# Patient Record
Sex: Male | Born: 1935 | Race: White | Hispanic: No | Marital: Single | Smoking: Never smoker
Health system: Southern US, Community
[De-identification: ages and names within clinical notes are randomized; demographics above are authoritative.]

## PROBLEM LIST (undated history)

## (undated) DIAGNOSIS — I639 Cerebral infarction, unspecified: Secondary | ICD-10-CM

## (undated) DIAGNOSIS — N189 Chronic kidney disease, unspecified: Secondary | ICD-10-CM

## (undated) DIAGNOSIS — H548 Legal blindness, as defined in USA: Secondary | ICD-10-CM

## (undated) DIAGNOSIS — G5620 Lesion of ulnar nerve, unspecified upper limb: Secondary | ICD-10-CM

## (undated) DIAGNOSIS — N400 Enlarged prostate without lower urinary tract symptoms: Secondary | ICD-10-CM

## (undated) DIAGNOSIS — G4733 Obstructive sleep apnea (adult) (pediatric): Secondary | ICD-10-CM

## (undated) DIAGNOSIS — R011 Cardiac murmur, unspecified: Secondary | ICD-10-CM

## (undated) HISTORY — DX: Lesion of ulnar nerve, unspecified upper limb: G56.20

## (undated) HISTORY — DX: Cardiac murmur, unspecified: R01.1

## (undated) HISTORY — PX: SPINE SURGERY: SHX786

## (undated) HISTORY — PX: INGUINAL HERNIA REPAIR: SHX194

## (undated) HISTORY — DX: Chronic kidney disease, unspecified: N18.9

## (undated) HISTORY — DX: Benign prostatic hyperplasia without lower urinary tract symptoms: N40.0

## (undated) HISTORY — DX: Legal blindness, as defined in USA: H54.8

## (undated) HISTORY — DX: Obstructive sleep apnea (adult) (pediatric): G47.33

---

## 2004-10-28 ENCOUNTER — Emergency Department (HOSPITAL_COMMUNITY): Admission: EM | Admit: 2004-10-28 | Discharge: 2004-10-28 | Payer: Self-pay | Admitting: Family Medicine

## 2011-10-25 ENCOUNTER — Encounter (HOSPITAL_COMMUNITY): Payer: Self-pay | Admitting: *Deleted

## 2011-10-25 ENCOUNTER — Emergency Department (HOSPITAL_COMMUNITY)
Admission: EM | Admit: 2011-10-25 | Discharge: 2011-10-25 | Disposition: A | Discharge status: Home / Self Care | Payer: Medicare Other | Attending: Emergency Medicine | Admitting: Emergency Medicine

## 2011-10-25 DIAGNOSIS — R319 Hematuria, unspecified: Secondary | ICD-10-CM | POA: Insufficient documentation

## 2011-10-25 DIAGNOSIS — Z8673 Personal history of transient ischemic attack (TIA), and cerebral infarction without residual deficits: Secondary | ICD-10-CM | POA: Insufficient documentation

## 2011-10-25 HISTORY — DX: Cerebral infarction, unspecified: I63.9

## 2011-10-25 LAB — URINALYSIS, ROUTINE W REFLEX MICROSCOPIC
Glucose, UA: NEGATIVE mg/dL
Ketones, ur: 15 mg/dL — AB
Protein, ur: 100 mg/dL — AB

## 2011-10-25 LAB — URINE MICROSCOPIC-ADD ON

## 2011-10-25 MED ORDER — CIPROFLOXACIN HCL 500 MG PO TABS: 500.0000 mg | ORAL_TABLET | Freq: Two times a day (BID) | ORAL | Status: AC

## 2011-10-25 MED ORDER — CIPROFLOXACIN HCL 500 MG PO TABS
500.0000 mg | ORAL_TABLET | Freq: Once | ORAL | Status: AC
  Administered 2011-10-25: 500 mg via ORAL
  Filled 2011-10-25: qty 1

## 2011-10-25 NOTE — ED Provider Notes (Signed)
History     CSN: [id number]  Arrival date & time 10/25/11  0212   First MD Initiated Contact with Patient 10/25/11 0325      Chief Complaint  Patient presents with  . Hematuria    (Consider location/radiation/quality/duration/timing/severity/associated sxs/prior treatment) HPI Comments: 78 year old male with a past medical history significant for a stroke but no other medical problems presents with a complaint of hematuria. He states that at 1:00 AM he had acute onset of bright red blood in his urine without any pain, hesitancy, dysuria, nausea, fevers or back pain. Symptoms are intermittent, there is the presence of blood clots associated with the urine, he has never had this before and is not on anticoagulation. He denies any other date weight loss fevers chills nausea vomiting or any other complaints of.  Patient is a 78 y.o. male presenting with hematuria. The history is provided by the patient and the spouse.  Hematuria    Past Medical History  Diagnosis Date  . Stroke     History reviewed. No pertinent past surgical history.  History reviewed. No pertinent family history.  History  Substance Use Topics  . Smoking status: Never Smoker   . Smokeless tobacco: Not on file  . Alcohol Use: No      Review of Systems  Genitourinary: Positive for hematuria.  All other systems reviewed and are negative.    Allergies  Review of patient's allergies indicates no known allergies.  Home Medications   Current Outpatient Rx  Name Route Sig Dispense Refill  . ACETAMINOPHEN 325 MG PO TABS Oral Take 650 mg by mouth every 6 (six) hours as needed. For pain    . CIPROFLOXACIN HCL 500 MG PO TABS Oral Take 1 tablet (500 mg total) by mouth 2 (two) times daily. 14 tablet 0    BP 156/90  Pulse 72  Temp(Src) 98.1 F (36.7 C) (Oral)  Resp 20  SpO2 95%  Physical Exam  Nursing note and vitals reviewed. Constitutional: He appears well-developed and well-nourished. No distress.    HENT:  Head: Normocephalic and atraumatic.  Mouth/Throat: Oropharynx is clear and moist. No oropharyngeal exudate.  Eyes: Conjunctivae and EOM are normal. Pupils are equal, round, and reactive to light. Right eye exhibits no discharge. Left eye exhibits no discharge. No scleral icterus.  Neck: Normal range of motion. Neck supple. No JVD present. No thyromegaly present.  Cardiovascular: Normal rate, regular rhythm, normal heart sounds and intact distal pulses.  Exam reveals no gallop and no friction rub.   No murmur heard. Pulmonary/Chest: Effort normal and breath sounds normal. No respiratory distress. He has no wheezes. He has no rales.  Abdominal: Soft. Bowel sounds are normal. He exhibits no distension and no mass. There is no tenderness.  Genitourinary:       Normal appearing penis and testicles, urethral meatus clear  Musculoskeletal: Normal range of motion. He exhibits no edema and no tenderness.  Lymphadenopathy:    He has no cervical adenopathy.  Neurological: He is alert. Coordination normal.  Skin: Skin is warm and dry. No rash noted. No erythema.  Psychiatric: He has a normal mood and affect. His behavior is normal.    ED Course  Procedures (including critical care time)  Labs Reviewed  URINALYSIS, ROUTINE W REFLEX MICROSCOPIC - Abnormal; Notable for the following:    Color, Urine RED (*) BIOCHEMICALS MAY BE AFFECTED BY COLOR   APPearance TURBID (*)    Hgb urine dipstick LARGE (*)    Ketones, ur  15 (*)    Protein, ur 100 (*)    Leukocytes, UA SMALL (*)    All other components within normal limits  URINE MICROSCOPIC-ADD ON  URINE CULTURE   No results found.   1. Hematuria - cause not known       MDM  No abdominal tenderness, normal appearing penis, vital signs with mild hypertension and urinalysis according to my interpretation has gross hematuria but no obvious signs of infection. Due to the acute onset of his symptoms without any other prodromal symptoms I  suspect this may be related to a urinary infection however if treatment with ciprofloxacin does not improve his symptoms will have referred to urologist for further workup for a systematic hematuria. Patient precautions given for return should he not be able to urinate.        Vida Roller, MD 10/25/11 [phone]

## 2011-10-25 NOTE — Discharge Instructions (Signed)
Take the antibiotic exactly as prescribed, return to see your doctor or the urologist listed above, call in the morning for an appointment this week. Return to the emergency Department if you're unable to urinate or started to develop lower abdominal pain, weakness, fever or severe vomiting.

## 2011-10-25 NOTE — ED Notes (Signed)
Bloody urine with clots since 0100am today. No  n or v

## 2011-10-25 NOTE — ED Notes (Addendum)
Patient reports hematuria since 0100 this morning; patients reports passing around 10 clots with one urination; patient denies history of hematuria.  Patient denies pain, including burning during urination and abdominal pain.  Patient denies taking blood thinners.  Patient alert and oriented x4; PERRL present. Upon arrival to room, patient changed into gown.  Urine sample collected.  Family present at bedside.  Will continue to monitor.

## 2011-10-30 ENCOUNTER — Other Ambulatory Visit: Payer: Self-pay | Admitting: Internal Medicine

## 2011-10-30 DIAGNOSIS — M5412 Radiculopathy, cervical region: Secondary | ICD-10-CM

## 2011-11-04 ENCOUNTER — Ambulatory Visit
Admission: RE | Admit: 2011-11-04 | Discharge: 2011-11-04 | Disposition: A | Discharge status: Home / Self Care | Payer: Medicare Other | Source: Ambulatory Visit | Attending: Internal Medicine | Admitting: Internal Medicine

## 2011-11-04 DIAGNOSIS — M5412 Radiculopathy, cervical region: Secondary | ICD-10-CM

## 2012-04-24 ENCOUNTER — Other Ambulatory Visit: Payer: Self-pay | Admitting: Neurological Surgery

## 2012-04-24 DIAGNOSIS — M542 Cervicalgia: Secondary | ICD-10-CM

## 2012-05-02 ENCOUNTER — Ambulatory Visit
Admission: RE | Admit: 2012-05-02 | Discharge: 2012-05-02 | Disposition: A | Discharge status: Home / Self Care | Payer: Medicare Other | Source: Ambulatory Visit | Attending: Neurological Surgery | Admitting: Neurological Surgery

## 2012-05-02 DIAGNOSIS — M542 Cervicalgia: Secondary | ICD-10-CM

## 2012-08-07 ENCOUNTER — Encounter (INDEPENDENT_AMBULATORY_CARE_PROVIDER_SITE_OTHER): Payer: Medicare Other | Admitting: Ophthalmology

## 2012-08-07 DIAGNOSIS — H47219 Primary optic atrophy, unspecified eye: Secondary | ICD-10-CM

## 2012-08-07 DIAGNOSIS — H43819 Vitreous degeneration, unspecified eye: Secondary | ICD-10-CM

## 2012-08-07 DIAGNOSIS — H3530 Unspecified macular degeneration: Secondary | ICD-10-CM

## 2012-08-07 DIAGNOSIS — H2510 Age-related nuclear cataract, unspecified eye: Secondary | ICD-10-CM

## 2012-08-10 ENCOUNTER — Encounter (INDEPENDENT_AMBULATORY_CARE_PROVIDER_SITE_OTHER): Payer: Medicare Other | Admitting: Ophthalmology

## 2012-12-14 ENCOUNTER — Ambulatory Visit (INDEPENDENT_AMBULATORY_CARE_PROVIDER_SITE_OTHER): Payer: Medicare Other | Admitting: Ophthalmology

## 2012-12-14 DIAGNOSIS — H35379 Puckering of macula, unspecified eye: Secondary | ICD-10-CM

## 2012-12-14 DIAGNOSIS — H2510 Age-related nuclear cataract, unspecified eye: Secondary | ICD-10-CM

## 2012-12-14 DIAGNOSIS — H3530 Unspecified macular degeneration: Secondary | ICD-10-CM

## 2012-12-14 DIAGNOSIS — H47219 Primary optic atrophy, unspecified eye: Secondary | ICD-10-CM

## 2012-12-14 DIAGNOSIS — H43819 Vitreous degeneration, unspecified eye: Secondary | ICD-10-CM

## 2013-06-15 ENCOUNTER — Ambulatory Visit (INDEPENDENT_AMBULATORY_CARE_PROVIDER_SITE_OTHER): Payer: Medicare Other | Admitting: Ophthalmology

## 2013-06-15 DIAGNOSIS — H3530 Unspecified macular degeneration: Secondary | ICD-10-CM

## 2013-06-15 DIAGNOSIS — Q120 Congenital cataract: Secondary | ICD-10-CM

## 2013-06-15 DIAGNOSIS — H43819 Vitreous degeneration, unspecified eye: Secondary | ICD-10-CM

## 2013-06-15 DIAGNOSIS — H35379 Puckering of macula, unspecified eye: Secondary | ICD-10-CM

## 2013-12-16 ENCOUNTER — Ambulatory Visit (INDEPENDENT_AMBULATORY_CARE_PROVIDER_SITE_OTHER): Payer: Medicare Other | Admitting: Ophthalmology

## 2013-12-16 DIAGNOSIS — H2510 Age-related nuclear cataract, unspecified eye: Secondary | ICD-10-CM

## 2013-12-16 DIAGNOSIS — H3530 Unspecified macular degeneration: Secondary | ICD-10-CM

## 2013-12-16 DIAGNOSIS — H43819 Vitreous degeneration, unspecified eye: Secondary | ICD-10-CM

## 2013-12-16 DIAGNOSIS — H47219 Primary optic atrophy, unspecified eye: Secondary | ICD-10-CM

## 2014-06-20 ENCOUNTER — Ambulatory Visit (INDEPENDENT_AMBULATORY_CARE_PROVIDER_SITE_OTHER): Payer: Medicare Other | Admitting: Ophthalmology

## 2014-07-26 ENCOUNTER — Encounter (INDEPENDENT_AMBULATORY_CARE_PROVIDER_SITE_OTHER): Payer: Medicare Other | Admitting: Ophthalmology

## 2014-07-28 ENCOUNTER — Encounter (INDEPENDENT_AMBULATORY_CARE_PROVIDER_SITE_OTHER): Payer: Medicare Other | Admitting: Ophthalmology

## 2014-07-28 DIAGNOSIS — H35372 Puckering of macula, left eye: Secondary | ICD-10-CM

## 2014-07-28 DIAGNOSIS — H43813 Vitreous degeneration, bilateral: Secondary | ICD-10-CM

## 2014-07-28 DIAGNOSIS — H3531 Nonexudative age-related macular degeneration: Secondary | ICD-10-CM

## 2014-07-28 DIAGNOSIS — H59032 Cystoid macular edema following cataract surgery, left eye: Secondary | ICD-10-CM

## 2014-09-08 ENCOUNTER — Encounter (INDEPENDENT_AMBULATORY_CARE_PROVIDER_SITE_OTHER): Payer: Medicare Other | Admitting: Ophthalmology

## 2014-09-08 DIAGNOSIS — H59032 Cystoid macular edema following cataract surgery, left eye: Secondary | ICD-10-CM

## 2014-09-08 DIAGNOSIS — H35372 Puckering of macula, left eye: Secondary | ICD-10-CM | POA: Diagnosis not present

## 2014-09-08 DIAGNOSIS — H3531 Nonexudative age-related macular degeneration: Secondary | ICD-10-CM

## 2014-09-08 DIAGNOSIS — H43813 Vitreous degeneration, bilateral: Secondary | ICD-10-CM | POA: Diagnosis not present

## 2014-11-03 ENCOUNTER — Encounter (INDEPENDENT_AMBULATORY_CARE_PROVIDER_SITE_OTHER): Payer: Medicare Other | Admitting: Ophthalmology

## 2014-11-03 DIAGNOSIS — H59032 Cystoid macular edema following cataract surgery, left eye: Secondary | ICD-10-CM

## 2014-11-03 DIAGNOSIS — H47211 Primary optic atrophy, right eye: Secondary | ICD-10-CM | POA: Diagnosis not present

## 2014-11-03 DIAGNOSIS — H3531 Nonexudative age-related macular degeneration: Secondary | ICD-10-CM | POA: Diagnosis not present

## 2014-11-03 DIAGNOSIS — H43813 Vitreous degeneration, bilateral: Secondary | ICD-10-CM | POA: Diagnosis not present

## 2014-11-03 DIAGNOSIS — H35372 Puckering of macula, left eye: Secondary | ICD-10-CM | POA: Diagnosis not present

## 2014-12-13 ENCOUNTER — Encounter (INDEPENDENT_AMBULATORY_CARE_PROVIDER_SITE_OTHER): Payer: Medicare Other | Admitting: Ophthalmology

## 2014-12-26 ENCOUNTER — Encounter (INDEPENDENT_AMBULATORY_CARE_PROVIDER_SITE_OTHER): Payer: Medicare Other | Admitting: Ophthalmology

## 2014-12-26 DIAGNOSIS — H3531 Nonexudative age-related macular degeneration: Secondary | ICD-10-CM | POA: Diagnosis not present

## 2014-12-26 DIAGNOSIS — H59032 Cystoid macular edema following cataract surgery, left eye: Secondary | ICD-10-CM

## 2014-12-26 DIAGNOSIS — H43813 Vitreous degeneration, bilateral: Secondary | ICD-10-CM | POA: Diagnosis not present

## 2015-01-13 ENCOUNTER — Other Ambulatory Visit: Payer: Self-pay | Admitting: Internal Medicine

## 2015-01-13 DIAGNOSIS — L03116 Cellulitis of left lower limb: Secondary | ICD-10-CM

## 2015-02-09 ENCOUNTER — Encounter: Payer: Self-pay | Admitting: Vascular Surgery

## 2015-02-09 ENCOUNTER — Other Ambulatory Visit: Payer: Self-pay | Admitting: *Deleted

## 2015-02-09 DIAGNOSIS — M79605 Pain in left leg: Secondary | ICD-10-CM

## 2015-03-02 ENCOUNTER — Encounter (INDEPENDENT_AMBULATORY_CARE_PROVIDER_SITE_OTHER): Payer: Medicare Other | Admitting: Ophthalmology

## 2015-03-02 DIAGNOSIS — H3531 Nonexudative age-related macular degeneration: Secondary | ICD-10-CM | POA: Diagnosis not present

## 2015-03-02 DIAGNOSIS — H59032 Cystoid macular edema following cataract surgery, left eye: Secondary | ICD-10-CM

## 2015-03-02 DIAGNOSIS — H35372 Puckering of macula, left eye: Secondary | ICD-10-CM

## 2015-03-02 DIAGNOSIS — H47211 Primary optic atrophy, right eye: Secondary | ICD-10-CM | POA: Diagnosis not present

## 2015-03-10 ENCOUNTER — Encounter: Payer: Self-pay | Admitting: Vascular Surgery

## 2015-03-14 ENCOUNTER — Encounter: Payer: Self-pay | Admitting: Vascular Surgery

## 2015-03-14 ENCOUNTER — Ambulatory Visit (HOSPITAL_COMMUNITY)
Admission: RE | Admit: 2015-03-14 | Discharge: 2015-03-14 | Disposition: A | Discharge status: Home / Self Care | Payer: Medicare Other | Source: Ambulatory Visit | Attending: Vascular Surgery | Admitting: Vascular Surgery

## 2015-03-14 ENCOUNTER — Ambulatory Visit (INDEPENDENT_AMBULATORY_CARE_PROVIDER_SITE_OTHER): Payer: Medicare Other | Admitting: Vascular Surgery

## 2015-03-14 VITALS — BP 154/92 | HR 74 | Temp 97.8°F | Resp 18 | Ht 69.0 in | Wt 242.0 lb

## 2015-03-14 DIAGNOSIS — I82812 Embolism and thrombosis of superficial veins of left lower extremities: Secondary | ICD-10-CM | POA: Diagnosis not present

## 2015-03-14 DIAGNOSIS — M79605 Pain in left leg: Secondary | ICD-10-CM | POA: Diagnosis present

## 2015-03-14 DIAGNOSIS — I8312 Varicose veins of left lower extremity with inflammation: Secondary | ICD-10-CM | POA: Diagnosis not present

## 2015-03-14 DIAGNOSIS — I8392 Asymptomatic varicose veins of left lower extremity: Secondary | ICD-10-CM | POA: Diagnosis not present

## 2015-03-14 NOTE — Progress Notes (Signed)
Patient name: Austin Dyer MRN: [id number] DOB: [birth year] Sex: male   Referred by: Bosie Clos  Reason for referral:  Chief Complaint  Patient presents with  . Re-evaluation    LLE pain   REF- Dr. Bosie Clos  History of thrombophlebitis LLE   Has been on antibiotics with no improvement.      HISTORY OF PRESENT ILLNESS:  patient presents today for evaluation of left leg venous hypertension. He is very pleasant 81 year old gentleman who has a remote history of what sounds like trivial trauma to his medial ankle and prolonged healing. Probably had distal vein stripping at that time from his knee distally. These records are not available. He has done well for many years. Recently he had an episode of inflammation and cellulitis on the medial aspect of his left ankle with marked swelling. He did respond to  antibody treatment. He did not develop an open ulcer at this time. He does not wear compression garments. His Brother and sister are with him today will care for him. He is legally blind.  Past Medical History  Diagnosis Date  . Stroke   . Sleep apnea, obstructive   . BPH (benign prostatic hyperplasia)   . Ulnar nerve compression   . Chronic kidney disease     History of kidney stone   . Heart murmur   . Legally blind     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, Legally blind in Left eye    Past Surgical History  Procedure Laterality Date  . Spine surgery      Disk surgery   . Inguinal hernia repair Bilateral     History   Social History  . Marital Status: Single    Spouse Name: N/A  . Number of Children: N/A  . Years of Education: N/A   Occupational History  . Not on file.   Social History Main Topics  . Smoking status: Never Smoker   . Smokeless tobacco: Not on file  . Alcohol Use: No  . Drug Use: No  . Sexual Activity: Not on file   Other Topics Concern  . Not on file   Social History Narrative    Family History  Problem Relation Age of Onset  . Cancer Mother    Sarcoma, rectal cancer  . Heart disease Father   . Diabetes Father   . Hypertension Sister   . Hyperlipidemia Sister   . Heart disease Sister   . Diabetes Sister   . Diabetes Brother     Allergies as of 03/14/2015  . (No Known Allergies)    Current Outpatient Prescriptions on File Prior to Visit  Medication Sig Dispense Refill  . acetaminophen (TYLENOL) 325 MG tablet Take 650 mg by mouth every 6 (six) hours as needed. For pain    . Brinzolamide-Brimonidine (SIMBRINZA) 1-0.2 % SUSP Apply 1 drop to eye. Place 1 (drop) into affected eye three times a day    . finasteride (PROSCAR) 5 MG tablet Take 5 mg by mouth daily.    . nepafenac (ILEVRO) 0.3 % ophthalmic suspension 1 drop daily.    . prednisoLONE acetate (PRED FORTE) 1 % ophthalmic suspension 1 drop 4 (four) times daily.    . cephALEXin (KEFLEX) 500 MG capsule Take 500 mg by mouth 2 (two) times daily.    . tamsulosin (FLOMAX) 0.4 MG CAPS capsule Take 0.4 mg by mouth daily. After the same meal each day.     No current facility-administered medications on file prior to  visit.     REVIEW OF SYSTEMS:  Positives indicated with an "X"  CARDIOVASCULAR:  [ ]  chest pain   [ ]  chest pressure   [ ]  palpitations   [ ]  orthopnea   [ ]  dyspnea on exertion   [ ]  claudication   [ ]  rest pain   [ ]  DVT   [x ] phlebitis PULMONARY:   [ ]  productive cough   [ ]  asthma   [ ]  wheezing NEUROLOGIC:   [ ]  weakness  [ ]  paresthesias  [ ]  aphasia  [ ]  amaurosis  [ ]  dizziness HEMATOLOGIC:   [ ]  bleeding problems   [ ]  clotting disorders MUSCULOSKELETAL:  [ ]  joint pain   [ ]  joint swelling GASTROINTESTINAL: [ ]   blood in stool  [ ]   hematemesis GENITOURINARY:  [ ]   dysuria  [ ]   hematuria PSYCHIATRIC:  [ ]  history of major depression INTEGUMENTARY:  [ ]  rashes  [ ]  ulcers CONSTITUTIONAL:  [ ]  fever   [ ]  chills  PHYSICAL EXAMINATION:  General: The patient is a well-nourished male, in no acute distress. Vital signs are BP 154/92 mmHg  Pulse 74   Temp(Src) 97.8 F (36.6 C) (Oral)  Resp 18  Ht 5\' 9"  (1.753 m)  Wt 242 lb (109.77 kg)  BMI 35.72 kg/m2  SpO2 99% Pulmonary: There is a good air exchange  Abdomen: Soft and non-tender  Musculoskeletal: There are no major deformities.  There is no significant extremity pain. Neurologic: No focal weakness or paresthesias are detected, Skin: significant swelling and erythema with old healed ulcer on the medial aspect of his left ankle. Psychiatric: The patient has normal affect. Cardiovascular:  2+ dorsalis pedis pulses bilaterally   [facility] Vascular Lab Studies:  Ordered and Independently Reviewed  This reveals reflux that is the great saphenous vein with marked enlargement of 1.o cm in diameter. There is superficial thrombophlebitis in the mid to distal calf great saphenous vein and varicosities surrounding this.  Impression and Plan:   chronic venous hypertension with superficial thrombophlebitis and cellulitis in the medial ankle. Fortunately has no evidence of DVT and no significant deep venous reflux. I did reimage his saphenous vein with SonoSite and this is quite enlarged throughout its course. He would be an excellent candidate for laser ablation of his great saphenous vein for reduction of venous hypertension should he fail conservative treatment. I have fitted him today for thigh high 20-30 mm graduated compression garments and instructed on the use of these. He does live alone and has debilities which may make it difficult for him to tolerate the compression. Plan see him again in 3 months for continued discussion    Sylar Voong [facility] Office: [phone]

## 2015-05-04 ENCOUNTER — Encounter (INDEPENDENT_AMBULATORY_CARE_PROVIDER_SITE_OTHER): Payer: Medicare Other | Admitting: Ophthalmology

## 2015-05-04 DIAGNOSIS — H35372 Puckering of macula, left eye: Secondary | ICD-10-CM | POA: Diagnosis not present

## 2015-05-04 DIAGNOSIS — H3531 Nonexudative age-related macular degeneration: Secondary | ICD-10-CM | POA: Diagnosis not present

## 2015-05-04 DIAGNOSIS — H59032 Cystoid macular edema following cataract surgery, left eye: Secondary | ICD-10-CM | POA: Diagnosis not present

## 2015-05-04 DIAGNOSIS — H47231 Glaucomatous optic atrophy, right eye: Secondary | ICD-10-CM | POA: Diagnosis not present

## 2015-05-04 DIAGNOSIS — H43813 Vitreous degeneration, bilateral: Secondary | ICD-10-CM

## 2015-06-27 ENCOUNTER — Ambulatory Visit: Payer: Medicare Other | Admitting: Vascular Surgery

## 2015-08-03 ENCOUNTER — Encounter (INDEPENDENT_AMBULATORY_CARE_PROVIDER_SITE_OTHER): Payer: Medicare Other | Admitting: Ophthalmology

## 2015-08-03 DIAGNOSIS — H35373 Puckering of macula, bilateral: Secondary | ICD-10-CM

## 2015-08-03 DIAGNOSIS — H59032 Cystoid macular edema following cataract surgery, left eye: Secondary | ICD-10-CM | POA: Diagnosis not present

## 2015-08-03 DIAGNOSIS — H353134 Nonexudative age-related macular degeneration, bilateral, advanced atrophic with subfoveal involvement: Secondary | ICD-10-CM

## 2015-08-03 DIAGNOSIS — H43813 Vitreous degeneration, bilateral: Secondary | ICD-10-CM

## 2015-12-14 ENCOUNTER — Ambulatory Visit (INDEPENDENT_AMBULATORY_CARE_PROVIDER_SITE_OTHER): Payer: Medicare Other | Admitting: Ophthalmology

## 2015-12-14 DIAGNOSIS — H353122 Nonexudative age-related macular degeneration, left eye, intermediate dry stage: Secondary | ICD-10-CM | POA: Diagnosis not present

## 2015-12-14 DIAGNOSIS — H59032 Cystoid macular edema following cataract surgery, left eye: Secondary | ICD-10-CM

## 2015-12-14 DIAGNOSIS — H43813 Vitreous degeneration, bilateral: Secondary | ICD-10-CM

## 2015-12-14 DIAGNOSIS — I10 Essential (primary) hypertension: Secondary | ICD-10-CM

## 2015-12-14 DIAGNOSIS — H353114 Nonexudative age-related macular degeneration, right eye, advanced atrophic with subfoveal involvement: Secondary | ICD-10-CM

## 2015-12-14 DIAGNOSIS — H35033 Hypertensive retinopathy, bilateral: Secondary | ICD-10-CM | POA: Diagnosis not present

## 2016-04-18 ENCOUNTER — Ambulatory Visit (INDEPENDENT_AMBULATORY_CARE_PROVIDER_SITE_OTHER): Payer: Medicare Other | Admitting: Ophthalmology

## 2016-04-18 DIAGNOSIS — H59032 Cystoid macular edema following cataract surgery, left eye: Secondary | ICD-10-CM

## 2016-04-18 DIAGNOSIS — H35033 Hypertensive retinopathy, bilateral: Secondary | ICD-10-CM | POA: Diagnosis not present

## 2016-04-18 DIAGNOSIS — I10 Essential (primary) hypertension: Secondary | ICD-10-CM | POA: Diagnosis not present

## 2016-04-18 DIAGNOSIS — H353132 Nonexudative age-related macular degeneration, bilateral, intermediate dry stage: Secondary | ICD-10-CM | POA: Diagnosis not present

## 2016-04-18 DIAGNOSIS — H43813 Vitreous degeneration, bilateral: Secondary | ICD-10-CM | POA: Diagnosis not present

## 2016-04-18 DIAGNOSIS — H35372 Puckering of macula, left eye: Secondary | ICD-10-CM

## 2016-08-27 ENCOUNTER — Ambulatory Visit (INDEPENDENT_AMBULATORY_CARE_PROVIDER_SITE_OTHER): Payer: Medicare Other | Admitting: Ophthalmology

## 2016-08-27 DIAGNOSIS — H47211 Primary optic atrophy, right eye: Secondary | ICD-10-CM | POA: Diagnosis not present

## 2016-08-27 DIAGNOSIS — H353132 Nonexudative age-related macular degeneration, bilateral, intermediate dry stage: Secondary | ICD-10-CM

## 2016-08-27 DIAGNOSIS — H59032 Cystoid macular edema following cataract surgery, left eye: Secondary | ICD-10-CM | POA: Diagnosis not present

## 2016-08-27 DIAGNOSIS — I10 Essential (primary) hypertension: Secondary | ICD-10-CM

## 2016-08-27 DIAGNOSIS — H35033 Hypertensive retinopathy, bilateral: Secondary | ICD-10-CM

## 2016-08-27 DIAGNOSIS — H35372 Puckering of macula, left eye: Secondary | ICD-10-CM

## 2016-12-26 ENCOUNTER — Ambulatory Visit (INDEPENDENT_AMBULATORY_CARE_PROVIDER_SITE_OTHER): Payer: Medicare Other | Admitting: Ophthalmology

## 2016-12-26 DIAGNOSIS — H47211 Primary optic atrophy, right eye: Secondary | ICD-10-CM | POA: Diagnosis not present

## 2016-12-26 DIAGNOSIS — H59032 Cystoid macular edema following cataract surgery, left eye: Secondary | ICD-10-CM

## 2016-12-26 DIAGNOSIS — H35373 Puckering of macula, bilateral: Secondary | ICD-10-CM | POA: Diagnosis not present

## 2016-12-26 DIAGNOSIS — H353132 Nonexudative age-related macular degeneration, bilateral, intermediate dry stage: Secondary | ICD-10-CM

## 2016-12-26 DIAGNOSIS — H35033 Hypertensive retinopathy, bilateral: Secondary | ICD-10-CM | POA: Diagnosis not present

## 2016-12-26 DIAGNOSIS — I10 Essential (primary) hypertension: Secondary | ICD-10-CM | POA: Diagnosis not present

## 2017-05-26 ENCOUNTER — Ambulatory Visit (INDEPENDENT_AMBULATORY_CARE_PROVIDER_SITE_OTHER): Payer: Medicare Other | Admitting: Ophthalmology

## 2017-05-26 DIAGNOSIS — H35373 Puckering of macula, bilateral: Secondary | ICD-10-CM | POA: Diagnosis not present

## 2017-05-26 DIAGNOSIS — I10 Essential (primary) hypertension: Secondary | ICD-10-CM

## 2017-05-26 DIAGNOSIS — H353132 Nonexudative age-related macular degeneration, bilateral, intermediate dry stage: Secondary | ICD-10-CM | POA: Diagnosis not present

## 2017-05-26 DIAGNOSIS — H59032 Cystoid macular edema following cataract surgery, left eye: Secondary | ICD-10-CM | POA: Diagnosis not present

## 2017-05-26 DIAGNOSIS — H35033 Hypertensive retinopathy, bilateral: Secondary | ICD-10-CM | POA: Diagnosis not present

## 2017-05-26 DIAGNOSIS — H43813 Vitreous degeneration, bilateral: Secondary | ICD-10-CM

## 2017-06-27 ENCOUNTER — Observation Stay: Payer: Medicare Other

## 2017-06-27 ENCOUNTER — Encounter: Payer: Self-pay | Admitting: Emergency Medicine

## 2017-06-27 ENCOUNTER — Inpatient Hospital Stay
Admission: EM | Admit: 2017-06-27 | Discharge: 2017-07-04 | DRG: 378 | Disposition: A | Discharge status: Skilled Nursing Facility | Payer: Medicare Other | Attending: Internal Medicine | Admitting: Internal Medicine

## 2017-06-27 ENCOUNTER — Emergency Department: Payer: Medicare Other

## 2017-06-27 DIAGNOSIS — R195 Other fecal abnormalities: Secondary | ICD-10-CM

## 2017-06-27 DIAGNOSIS — D62 Acute posthemorrhagic anemia: Secondary | ICD-10-CM | POA: Diagnosis present

## 2017-06-27 DIAGNOSIS — Y92009 Unspecified place in unspecified non-institutional (private) residence as the place of occurrence of the external cause: Secondary | ICD-10-CM

## 2017-06-27 DIAGNOSIS — M25559 Pain in unspecified hip: Secondary | ICD-10-CM | POA: Diagnosis present

## 2017-06-27 DIAGNOSIS — R4182 Altered mental status, unspecified: Secondary | ICD-10-CM

## 2017-06-27 DIAGNOSIS — G4733 Obstructive sleep apnea (adult) (pediatric): Secondary | ICD-10-CM | POA: Diagnosis present

## 2017-06-27 DIAGNOSIS — N179 Acute kidney failure, unspecified: Secondary | ICD-10-CM

## 2017-06-27 DIAGNOSIS — J9811 Atelectasis: Secondary | ICD-10-CM | POA: Diagnosis not present

## 2017-06-27 DIAGNOSIS — N400 Enlarged prostate without lower urinary tract symptoms: Secondary | ICD-10-CM | POA: Diagnosis present

## 2017-06-27 DIAGNOSIS — W19XXXA Unspecified fall, initial encounter: Secondary | ICD-10-CM

## 2017-06-27 DIAGNOSIS — R51 Headache: Secondary | ICD-10-CM | POA: Diagnosis not present

## 2017-06-27 DIAGNOSIS — K254 Chronic or unspecified gastric ulcer with hemorrhage: Secondary | ICD-10-CM | POA: Diagnosis not present

## 2017-06-27 DIAGNOSIS — D638 Anemia in other chronic diseases classified elsewhere: Secondary | ICD-10-CM | POA: Diagnosis present

## 2017-06-27 DIAGNOSIS — R338 Other retention of urine: Secondary | ICD-10-CM | POA: Diagnosis present

## 2017-06-27 DIAGNOSIS — Z8673 Personal history of transient ischemic attack (TIA), and cerebral infarction without residual deficits: Secondary | ICD-10-CM

## 2017-06-27 DIAGNOSIS — D72829 Elevated white blood cell count, unspecified: Secondary | ICD-10-CM | POA: Diagnosis not present

## 2017-06-27 DIAGNOSIS — M25551 Pain in right hip: Secondary | ICD-10-CM | POA: Diagnosis present

## 2017-06-27 DIAGNOSIS — R05 Cough: Secondary | ICD-10-CM

## 2017-06-27 DIAGNOSIS — H548 Legal blindness, as defined in USA: Secondary | ICD-10-CM | POA: Diagnosis present

## 2017-06-27 DIAGNOSIS — R059 Cough, unspecified: Secondary | ICD-10-CM

## 2017-06-27 DIAGNOSIS — E876 Hypokalemia: Secondary | ICD-10-CM | POA: Diagnosis not present

## 2017-06-27 DIAGNOSIS — N401 Enlarged prostate with lower urinary tract symptoms: Secondary | ICD-10-CM | POA: Diagnosis present

## 2017-06-27 DIAGNOSIS — N183 Chronic kidney disease, stage 3 (moderate): Secondary | ICD-10-CM | POA: Diagnosis present

## 2017-06-27 DIAGNOSIS — I129 Hypertensive chronic kidney disease with stage 1 through stage 4 chronic kidney disease, or unspecified chronic kidney disease: Secondary | ICD-10-CM | POA: Diagnosis present

## 2017-06-27 DIAGNOSIS — K922 Gastrointestinal hemorrhage, unspecified: Secondary | ICD-10-CM

## 2017-06-27 DIAGNOSIS — R109 Unspecified abdominal pain: Secondary | ICD-10-CM

## 2017-06-27 LAB — COMPREHENSIVE METABOLIC PANEL
ALK PHOS: 61 U/L (ref 38–126)
ALT: 12 U/L — AB (ref 17–63)
AST: 17 U/L (ref 15–41)
Albumin: 3 g/dL — ABNORMAL LOW (ref 3.5–5.0)
Anion gap: 7 (ref 5–15)
BILIRUBIN TOTAL: 0.5 mg/dL (ref 0.3–1.2)
BUN: 93 mg/dL — AB (ref 6–20)
CALCIUM: 8.9 mg/dL (ref 8.9–10.3)
CHLORIDE: 109 mmol/L (ref 101–111)
CO2: 24 mmol/L (ref 22–32)
CREATININE: 1.95 mg/dL — AB (ref 0.61–1.24)
GFR, EST AFRICAN AMERICAN: 35 mL/min — AB (ref >60)
GFR, EST NON AFRICAN AMERICAN: 31 mL/min — AB (ref >60)
Glucose, Bld: 131 mg/dL — ABNORMAL HIGH (ref 65–99)
Potassium: 4.4 mmol/L (ref 3.5–5.1)
Sodium: 140 mmol/L (ref 135–145)
TOTAL PROTEIN: 6.6 g/dL (ref 6.5–8.1)

## 2017-06-27 LAB — CBC WITH DIFFERENTIAL/PLATELET
BASOS ABS: 0.1 10*3/uL (ref 0–0.1)
BASOS PCT: 1 %
EOS ABS: 0.4 10*3/uL (ref 0–0.7)
EOS PCT: 2 %
HCT: 27.1 % — ABNORMAL LOW (ref 40.0–52.0)
HEMOGLOBIN: 8.9 g/dL — AB (ref 13.0–18.0)
Lymphocytes Relative: 8 %
Lymphs Abs: 1.2 10*3/uL (ref 1.0–3.6)
MCH: 26.8 pg (ref 26.0–34.0)
MCHC: 32.7 g/dL (ref 32.0–36.0)
MCV: 82 fL (ref 80.0–100.0)
Monocytes Absolute: 0.8 10*3/uL (ref 0.2–1.0)
Monocytes Relative: 5 %
NEUTROS PCT: 84 %
Neutro Abs: 13 10*3/uL — ABNORMAL HIGH (ref 1.4–6.5)
PLATELETS: 307 10*3/uL (ref 150–440)
RBC: 3.31 MIL/uL — AB (ref 4.40–5.90)
RDW: 14 % (ref 11.5–14.5)
WBC: 15.6 10*3/uL — AB (ref 3.8–10.6)

## 2017-06-27 LAB — CK: Total CK: 85 U/L (ref 49–397)

## 2017-06-27 LAB — TROPONIN I: TROPONIN I: 0.04 ng/mL — AB (ref <0.03)

## 2017-06-27 MED ORDER — ACETAMINOPHEN 325 MG PO TABS
650.0000 mg | ORAL_TABLET | Freq: Four times a day (QID) | ORAL | Status: DC | PRN
  Administered 2017-06-29 – 2017-07-04 (×8): 650 mg via ORAL
  Filled 2017-06-27 (×8): qty 2

## 2017-06-27 MED ORDER — BISACODYL 5 MG PO TBEC: 5.0000 mg | DELAYED_RELEASE_TABLET | Freq: Every day | ORAL | Status: DC | PRN

## 2017-06-27 MED ORDER — SODIUM CHLORIDE 0.9 % IV SOLN
8.0000 mg/h | INTRAVENOUS | Status: DC
  Administered 2017-06-27 – 2017-06-29 (×5): 8 mg/h via INTRAVENOUS
  Filled 2017-06-27 (×6): qty 80

## 2017-06-27 MED ORDER — DOCUSATE SODIUM 100 MG PO CAPS
100.0000 mg | ORAL_CAPSULE | Freq: Two times a day (BID) | ORAL | Status: DC
  Administered 2017-06-28 (×2): 100 mg via ORAL
  Filled 2017-06-27 (×2): qty 1

## 2017-06-27 MED ORDER — SODIUM CHLORIDE 0.9 % IV SOLN
INTRAVENOUS | Status: DC
  Administered 2017-06-27 – 2017-06-28 (×2): via INTRAVENOUS

## 2017-06-27 MED ORDER — SODIUM CHLORIDE 0.9 % IV SOLN
80.0000 mg | Freq: Once | INTRAVENOUS | Status: AC
  Administered 2017-06-27: 80 mg via INTRAVENOUS
  Filled 2017-06-27: qty 80

## 2017-06-27 MED ORDER — PANTOPRAZOLE SODIUM 40 MG IV SOLR: 40.0000 mg | Freq: Two times a day (BID) | INTRAVENOUS | Status: DC

## 2017-06-27 MED ORDER — HYDROCODONE-ACETAMINOPHEN 5-325 MG PO TABS: 1.0000 | ORAL_TABLET | ORAL | Status: DC | PRN

## 2017-06-27 MED ORDER — ACETAMINOPHEN 650 MG RE SUPP: 650.0000 mg | Freq: Four times a day (QID) | RECTAL | Status: DC | PRN

## 2017-06-27 MED ORDER — PANTOPRAZOLE SODIUM 40 MG IV SOLR
40.0000 mg | Freq: Once | INTRAVENOUS | Status: AC
  Administered 2017-06-27: 40 mg via INTRAVENOUS
  Filled 2017-06-27: qty 40

## 2017-06-27 MED ORDER — ONDANSETRON HCL 4 MG/2ML IJ SOLN
4.0000 mg | Freq: Four times a day (QID) | INTRAMUSCULAR | Status: DC | PRN
  Administered 2017-06-30: 4 mg via INTRAVENOUS
  Filled 2017-06-27: qty 2

## 2017-06-27 MED ORDER — ONDANSETRON HCL 4 MG PO TABS: 4.0000 mg | ORAL_TABLET | Freq: Four times a day (QID) | ORAL | Status: DC | PRN

## 2017-06-27 MED ORDER — FINASTERIDE 5 MG PO TABS
5.0000 mg | ORAL_TABLET | Freq: Every day | ORAL | Status: DC
  Administered 2017-06-27 – 2017-07-03 (×7): 5 mg via ORAL
  Filled 2017-06-27 (×7): qty 1

## 2017-06-27 MED ORDER — HYDROCODONE-ACETAMINOPHEN 5-325 MG PO TABS
1.0000 | ORAL_TABLET | Freq: Once | ORAL | Status: DC
  Filled 2017-06-27 (×2): qty 1

## 2017-06-27 MED ORDER — TRAZODONE HCL 50 MG PO TABS
25.0000 mg | ORAL_TABLET | Freq: Every evening | ORAL | Status: DC | PRN
  Administered 2017-06-30 – 2017-07-01 (×2): 25 mg via ORAL
  Filled 2017-06-27 (×2): qty 1

## 2017-06-27 NOTE — ED Provider Notes (Signed)
Differential diagnosis includes, but is not limited to, alcohol, illicit or prescription medications, or other toxic ingestion; intracranial pathology such as stroke or intracerebral hemorrhage; fever or infectious causes including sepsis; hypoxemia and/or hypercarbia; uremia; trauma; endocrine related disorders such as diabetes, hypoglycemia, and thyroid-related diseases; hypertensive encephalopathy; etc.   [facility]. On exam patient is slightly altered. Patient is anemic. No recent blood work available but I do think this could explain the patient's weakness. It appears likely gi loss given melena. Patient was started on protonix. CT head without acute finding. Patient's mental status did seem to improve after starting IV fluids. Will plan on admission.    Austin Dyer, Austin Laker, MD 06/27/17 [phone]

## 2017-06-27 NOTE — ED Notes (Signed)
This RN notified by PA that O2 applied due to patient becoming diaphoretic and pale while she was at bedside. This RN to bedside to obtain vitals. Pt on 2L via [place] at this time, noted to be diaphoretic, an acute change from prior interactions, also noted to be more lethargic, an acute change from prior interaction.

## 2017-06-27 NOTE — ED Triage Notes (Signed)
FIRST NURSE NOTE-pt fell today and c/o hip pain. Was able to get out of car to wheel chair with minimal assistance.
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
## 2017-06-27 NOTE — ED Notes (Signed)
THIS RN VERBALLY SPOKE WITH EDP AND VERBAL INSTRUCTIONS GIVEN TO ONLY GIVE HALF OF BOLUS BAG TO EQUAL A FULL 80MG  OF PROTONIX BOLUS.  40 MG PREVIOUSLY GIVEN BY MEGAN JONES, RN PER MAR RECORD.

## 2017-06-27 NOTE — H&P (Signed)
[employer] - [place] [facility]   PATIENT NAME: Austin Dyer    MR#:  [id number]  DATE OF BIRTH:  10/04/1933  DATE OF ADMISSION:  06/27/2017  PRIMARY CARE PHYSICIAN: Shamleffer, Landry MellowIbethal Jaralla, MD   REQUESTING/REFERRING PHYSICIAN: [name], Graydon, MD  CHIEF COMPLAINT:   Chief Complaint  Patient presents with  . Fall    HISTORY OF PRESENT ILLNESS:  Austin Dyer  is a 83 y.o. male with a known history of BPH, STROKE is being admitted for hip pain. Patient states that he fell last night that did not hit his head and did not lose consciousness. Patient was able to get up and get dressed this morning and with the help of neighbors got to the car to be brought to the emergency department. Patient states he was ambulatory after the accident. He attributes his fall to "his knees going out"  He rates his pain as 4 out of 10. Patient lives alone.  His sister and brother are at bedside.  ED provider was worried about bleeding from GI tract, his Hemoccult stool was positive and hemoglobin was noted to be 8.9.  Felt this was likely contributing to his weakness as well. PAST MEDICAL HISTORY:   Past Medical History:  Diagnosis Date  . BPH (benign prostatic hyperplasia)   . Chronic kidney disease    History of kidney stone   . Heart murmur   . Legally blind    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, Legally blind in Left eye  . Sleep apnea, obstructive   . Stroke (HCC)   . Ulnar nerve compression     PAST SURGICAL HISTORY:   Past Surgical History:  Procedure Laterality Date  . INGUINAL HERNIA REPAIR Bilateral   . SPINE SURGERY     Disk surgery     SOCIAL HISTORY:   Social History  Substance Use Topics  . Smoking status: Never Smoker  . Smokeless tobacco: Never Used  . Alcohol use No    FAMILY HISTORY:   Family History  Problem Relation Age of Onset  . Cancer Mother        Sarcoma, rectal cancer  . Heart disease Father   . Diabetes Father   . Hypertension Sister     . Hyperlipidemia Sister   . Heart disease Sister   . Diabetes Sister   . Diabetes Brother     DRUG ALLERGIES:  No Known Allergies  REVIEW OF SYSTEMS:   Review of Systems  Constitutional: Positive for malaise/fatigue. Negative for chills, fever and weight loss.  HENT: Negative for nosebleeds and sore throat.   Eyes: Negative for blurred vision.       Legally blind - 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, Legally blind in Left eye  Respiratory: Negative for cough, shortness of breath and wheezing.   Cardiovascular: Negative for chest pain, orthopnea, leg swelling and PND.  Gastrointestinal: Positive for blood in stool and melena. Negative for abdominal pain, constipation, diarrhea, heartburn, nausea and vomiting.  Genitourinary: Negative for dysuria and urgency.  Musculoskeletal: Positive for falls. Negative for back pain.  Skin: Negative for rash.  Neurological: Positive for weakness. Negative for dizziness, speech change, focal weakness and headaches.  Endo/Heme/Allergies: Does not bruise/bleed easily.  Psychiatric/Behavioral: Negative for depression.   MEDICATIONS AT HOME:   Prior to Admission medications   Medication Sig Start Date End Date Taking? Authorizing Provider  Brinzolamide-Brimonidine [facility](SIMBRINZA) 1-0.2 % SUSP Apply 1 drop to eye. Place 1 (drop) into affected eye three  times a day   Yes [provider]  finasteride (PROSCAR) 5 MG tablet Take 5 mg by mouth daily.   Yes [provider]  prednisoLONE acetate (PRED FORTE) 1 % ophthalmic suspension 1 drop 4 (four) times daily.   Yes [provider]  tamsulosin (FLOMAX) 0.4 MG CAPS capsule Take 0.4 mg by mouth daily. After the same meal each day.   Yes [provider]  acetaminophen (TYLENOL) 325 MG tablet Take 650 mg by mouth every 6 (six) hours as needed. For pain    [provider]      VITAL SIGNS:  Blood pressure (!) 123/58, pulse 95, temperature 98.7 F (37.1 C), temperature  source Oral, resp. rate (!) 22, weight 109.8 kg (242 lb), SpO2 99 %. PHYSICAL EXAMINATION:  Physical Exam  GENERAL:  83 y.o.-year-old patient lying in the bed with no acute distress.  EYES: Pupils equal, round, reactive to light and accommodation. No scleral icterus. Extraocular muscles intact.  HEENT: Head atraumatic, normocephalic. Oropharynx and nasopharynx clear.  NECK:  Supple, no jugular venous distention. No thyroid enlargement, no tenderness.  LUNGS: Normal breath sounds bilaterally, no wheezing, rales,rhonchi or crepitation. No use of accessory muscles of respiration.  CARDIOVASCULAR: S1, S2 normal. No murmurs, rubs, or gallops.  ABDOMEN: Soft, nontender, nondistended. Bowel sounds present. No organomegaly or mass.  EXTREMITIES: No pedal edema, cyanosis, or clubbing.  Tenderness at right hip area NEUROLOGIC: Cranial nerves II through XII are intact. Muscle strength 5/5 in all extremities. Sensation intact. Gait not checked.  PSYCHIATRIC: The patient is alert and oriented x 3.  SKIN: No obvious rash, lesion, or ulcer.  LABORATORY PANEL:   CBC  Recent Labs Lab 06/27/17 1332  WBC 15.6*  HGB 8.9*  HCT 27.1*  PLT 307   ------------------------------------------------------------------------------------------------------------------  Chemistries   Recent Labs Lab 06/27/17 1332  NA 140  K 4.4  CL 109  CO2 24  GLUCOSE 131*  BUN 93*  CREATININE 1.95*  CALCIUM 8.9  AST 17  ALT 12*  ALKPHOS 61  BILITOT 0.5   ------------------------------------------------------------------------------------------------------------------  Cardiac Enzymes  Recent Labs Lab 06/27/17 1332  TROPONINI 0.04*   ------------------------------------------------------------------------------------------------------------------  RADIOLOGY:  Ct Head Wo Contrast  Result Date: 06/27/2017 CLINICAL DATA:  Patient fell last evening with multiple falls recently. Confusion and neck pain  following fall. EXAM: CT HEAD WITHOUT CONTRAST CT CERVICAL SPINE WITHOUT CONTRAST TECHNIQUE: Multidetector CT imaging of the head and cervical spine was performed following the standard protocol without intravenous contrast. Multiplanar CT image reconstructions of the cervical spine were also generated. COMPARISON:  11/04/2011 MRI of the cervical spine FINDINGS: CT HEAD FINDINGS BRAIN: There is mild sulcal and mild to moderate ventricular prominence consistent with superficial and central atrophy. No intraparenchymal hemorrhage, mass effect nor midline shift. Periventricular and subcortical white matter hypodensities consistent with chronic small vessel ischemic disease are identified. Idiopathic basal ganglial calcifications are noted bilaterally. No acute large vascular territory infarcts. No abnormal extra-axial fluid collections. Basal cisterns are not effaced and midline. VASCULAR: Moderate calcific atherosclerosis of the carotid siphons. SKULL: No skull fracture. No significant scalp soft tissue swelling. SINUSES/ORBITS: The mastoid air-cells are clear. The included paranasal sinuses are well-aerated.The included ocular globes and orbital contents are non-suspicious. OTHER: None. CT CERVICAL SPINE FINDINGS Alignment: Intact atlantodental and craniocervical relationships. Normal cervical lordosis. Skull base and vertebrae: Incomplete posterior arch of C1, developmental in appearance. Subcortical cystic change of the anterior odontoid body. No acute cervical spine fracture. Intact skullbase. Soft tissues and  spinal canal: No prevertebral fluid or swelling. No visible canal hematoma. Disc levels: C2-C3: No significant central or neural foraminal encroachment. No focal disc herniation. C3-C4: Partially calcified mild disc bulge. No significant neural foraminal or central canal stenosis. C4-C5: Mild partially calcified disc bulge without focal disc herniation, canal stenosis or neural foraminal encroachment. Mild  left C4-5 uncinate process hypertrophy. C5-C6: No focal disc herniation, canal stenosis or significant neural foraminal encroachment. Small anterior osteophytes. Mild left-sided uncinate process hypertrophy. C6-C7: Small anterior osteophytes. No focal disc herniation, canal stenosis or significant neural foraminal encroachment. C7-T1:  Negative Upper chest: Left apical scarring. Other: No jumped or perched facets. IMPRESSION: 1. No acute intracranial nor cervical spine abnormality. 2. Chronic small vessel ischemic disease periventricular and subcortical white matter. 3. Mild cervical spondylosis. Electronically Signed   By: Tollie Eth M.D.   On: 06/27/2017 13:25   Ct Cervical Spine Wo Contrast  Result Date: 06/27/2017 CLINICAL DATA:  Patient fell last evening with multiple falls recently. Confusion and neck pain following fall. EXAM: CT HEAD WITHOUT CONTRAST CT CERVICAL SPINE WITHOUT CONTRAST TECHNIQUE: Multidetector CT imaging of the head and cervical spine was performed following the standard protocol without intravenous contrast. Multiplanar CT image reconstructions of the cervical spine were also generated. COMPARISON:  11/04/2011 MRI of the cervical spine FINDINGS: CT HEAD FINDINGS BRAIN: There is mild sulcal and mild to moderate ventricular prominence consistent with superficial and central atrophy. No intraparenchymal hemorrhage, mass effect nor midline shift. Periventricular and subcortical white matter hypodensities consistent with chronic small vessel ischemic disease are identified. Idiopathic basal ganglial calcifications are noted bilaterally. No acute large vascular territory infarcts. No abnormal extra-axial fluid collections. Basal cisterns are not effaced and midline. VASCULAR: Moderate calcific atherosclerosis of the carotid siphons. SKULL: No skull fracture. No significant scalp soft tissue swelling. SINUSES/ORBITS: The mastoid air-cells are clear. The included paranasal sinuses are  well-aerated.The included ocular globes and orbital contents are non-suspicious. OTHER: None. CT CERVICAL SPINE FINDINGS Alignment: Intact atlantodental and craniocervical relationships. Normal cervical lordosis. Skull base and vertebrae: Incomplete posterior arch of C1, developmental in appearance. Subcortical cystic change of the anterior odontoid body. No acute cervical spine fracture. Intact skullbase. Soft tissues and spinal canal: No prevertebral fluid or swelling. No visible canal hematoma. Disc levels: C2-C3: No significant central or neural foraminal encroachment. No focal disc herniation. C3-C4: Partially calcified mild disc bulge. No significant neural foraminal or central canal stenosis. C4-C5: Mild partially calcified disc bulge without focal disc herniation, canal stenosis or neural foraminal encroachment. Mild left C4-5 uncinate process hypertrophy. C5-C6: No focal disc herniation, canal stenosis or significant neural foraminal encroachment. Small anterior osteophytes. Mild left-sided uncinate process hypertrophy. C6-C7: Small anterior osteophytes. No focal disc herniation, canal stenosis or significant neural foraminal encroachment. C7-T1:  Negative Upper chest: Left apical scarring. Other: No jumped or perched facets. IMPRESSION: 1. No acute intracranial nor cervical spine abnormality. 2. Chronic small vessel ischemic disease periventricular and subcortical white matter. 3. Mild cervical spondylosis. Electronically Signed   By: Tollie Eth M.D.   On: 06/27/2017 13:25   Dg Hip Unilat W Or Wo Pelvis 2-3 Views Right  Result Date: 06/27/2017 CLINICAL DATA:  Right hip pain after fall yesterday. EXAM: DG HIP (WITH OR WITHOUT PELVIS) 2-3V RIGHT COMPARISON:  CT abdomen and pelvis dated October 29, 2011. FINDINGS: No acute fracture or malalignment. The bilateral hip joint spaces are relatively preserved. Probable small osteochondroma arising from the right medial femoral neck, unchanged. The sacroiliac  joints and  pubic symphysis are intact. Degenerative changes of the lower lumbar spine. IMPRESSION: No acute fracture or malalignment. If occult hip fracture is suspected or if the patient is unable to bear weight, MRI is the preferred modality for further evaluation. Electronically Signed   By: Obie Dredge M.D.   On: 06/27/2017 14:20   IMPRESSION AND PLAN:  83 y.o. male being admitted for right buttocks pain and hip pain, also weakness and melena.   *Right hip pain -X-ray is negative any acute pathology -Physical therapy evaluation, may need placement -With his continued pain we will go ahead and get MRI of hip, consider ortho consult  *Melena/GI blood loss -Stool Hemoccult was positive in the ED -We will consult GI -Monitor hemoglobin  *Acute renal failure -IV hydration and monitor creatinine  *BPH -Continue tamsulosin   Patient lives alone, his wife died 5 years ago.  His sister and brother lives in [place] and checks on him periodically but it has been difficult for them as well as patient.    All the records are reviewed and case discussed with ED provider. Management plans discussed with the patient, family and they are in agreement.  CODE STATUS: full code  TOTAL TIME TAKING CARE OF THIS PATIENT: 45 minutes.    Delfino Lovett M.D on 06/27/2017 at 7:44 PM  Between 7am to 6pm - Pager - [phone]  After 6pm go to [email or web address] - Scientist, research (life sciences) [facility]  Office  [phone]  CC: Primary care physician; Shamleffer, Landry Mellow, MD   Note: This dictation was prepared with Dragon dictation along with smaller phrase technology. Any transcriptional errors that result from this process are unintentional.

## 2017-06-27 NOTE — ED Triage Notes (Signed)
Pt states that he fell last night. Pt is c/o pain in the right buttock area. Pt to ambulate with minimal assistance. Pt in NAD at this time.

## 2017-06-28 DIAGNOSIS — G4733 Obstructive sleep apnea (adult) (pediatric): Secondary | ICD-10-CM | POA: Diagnosis present

## 2017-06-28 DIAGNOSIS — D638 Anemia in other chronic diseases classified elsewhere: Secondary | ICD-10-CM | POA: Diagnosis present

## 2017-06-28 DIAGNOSIS — N401 Enlarged prostate with lower urinary tract symptoms: Secondary | ICD-10-CM | POA: Diagnosis present

## 2017-06-28 DIAGNOSIS — M25551 Pain in right hip: Secondary | ICD-10-CM | POA: Diagnosis present

## 2017-06-28 DIAGNOSIS — R51 Headache: Secondary | ICD-10-CM | POA: Diagnosis not present

## 2017-06-28 DIAGNOSIS — J9811 Atelectasis: Secondary | ICD-10-CM | POA: Diagnosis not present

## 2017-06-28 DIAGNOSIS — R338 Other retention of urine: Secondary | ICD-10-CM | POA: Diagnosis present

## 2017-06-28 DIAGNOSIS — K922 Gastrointestinal hemorrhage, unspecified: Secondary | ICD-10-CM | POA: Diagnosis not present

## 2017-06-28 DIAGNOSIS — H548 Legal blindness, as defined in USA: Secondary | ICD-10-CM | POA: Diagnosis present

## 2017-06-28 DIAGNOSIS — N179 Acute kidney failure, unspecified: Secondary | ICD-10-CM | POA: Diagnosis present

## 2017-06-28 DIAGNOSIS — D72829 Elevated white blood cell count, unspecified: Secondary | ICD-10-CM | POA: Diagnosis not present

## 2017-06-28 DIAGNOSIS — W19XXXA Unspecified fall, initial encounter: Secondary | ICD-10-CM | POA: Diagnosis present

## 2017-06-28 DIAGNOSIS — Z8673 Personal history of transient ischemic attack (TIA), and cerebral infarction without residual deficits: Secondary | ICD-10-CM | POA: Diagnosis not present

## 2017-06-28 DIAGNOSIS — I129 Hypertensive chronic kidney disease with stage 1 through stage 4 chronic kidney disease, or unspecified chronic kidney disease: Secondary | ICD-10-CM | POA: Diagnosis present

## 2017-06-28 DIAGNOSIS — N183 Chronic kidney disease, stage 3 (moderate): Secondary | ICD-10-CM | POA: Diagnosis present

## 2017-06-28 DIAGNOSIS — K254 Chronic or unspecified gastric ulcer with hemorrhage: Secondary | ICD-10-CM | POA: Diagnosis present

## 2017-06-28 DIAGNOSIS — N400 Enlarged prostate without lower urinary tract symptoms: Secondary | ICD-10-CM | POA: Diagnosis present

## 2017-06-28 DIAGNOSIS — D62 Acute posthemorrhagic anemia: Secondary | ICD-10-CM | POA: Diagnosis not present

## 2017-06-28 DIAGNOSIS — E876 Hypokalemia: Secondary | ICD-10-CM | POA: Diagnosis not present

## 2017-06-28 LAB — GLUCOSE, CAPILLARY
GLUCOSE-CAPILLARY: 95 mg/dL (ref 65–99)
GLUCOSE-CAPILLARY: 109 mg/dL — AB (ref 65–99)
Glucose-Capillary: 105 mg/dL — ABNORMAL HIGH (ref 65–99)
Glucose-Capillary: 105 mg/dL — ABNORMAL HIGH (ref 65–99)

## 2017-06-28 LAB — CBC
HCT: 20.5 % — ABNORMAL LOW (ref 40.0–52.0)
Hemoglobin: 6.5 g/dL — ABNORMAL LOW (ref 13.0–18.0)
MCH: 26.2 pg (ref 26.0–34.0)
MCHC: 31.6 g/dL — ABNORMAL LOW (ref 32.0–36.0)
MCV: 82.9 fL (ref 80.0–100.0)
PLATELETS: 282 10*3/uL (ref 150–440)
RBC: 2.48 MIL/uL — ABNORMAL LOW (ref 4.40–5.90)
RDW: 13.9 % (ref 11.5–14.5)
WBC: 16.6 10*3/uL — AB (ref 3.8–10.6)

## 2017-06-28 LAB — BASIC METABOLIC PANEL
Anion gap: 7 (ref 5–15)
BUN: 95 mg/dL — AB (ref 6–20)
CALCIUM: 8.6 mg/dL — AB (ref 8.9–10.3)
CHLORIDE: 114 mmol/L — AB (ref 101–111)
CO2: 22 mmol/L (ref 22–32)
CREATININE: 1.81 mg/dL — AB (ref 0.61–1.24)
GFR calc Af Amer: 39 mL/min — ABNORMAL LOW (ref >60)
GFR calc non Af Amer: 33 mL/min — ABNORMAL LOW (ref >60)
Glucose, Bld: 103 mg/dL — ABNORMAL HIGH (ref 65–99)
Potassium: 4.1 mmol/L (ref 3.5–5.1)
SODIUM: 143 mmol/L (ref 135–145)

## 2017-06-28 LAB — ABO/RH: ABO/RH(D): A POS

## 2017-06-28 LAB — PREPARE RBC (CROSSMATCH)

## 2017-06-28 MED ORDER — SODIUM CHLORIDE 0.9 % IV SOLN: INTRAVENOUS | Status: DC

## 2017-06-28 MED ORDER — SODIUM CHLORIDE 0.9 % IV SOLN
Freq: Once | INTRAVENOUS | Status: AC
  Administered 2017-06-28: 16:00:00 via INTRAVENOUS

## 2017-06-28 NOTE — Progress Notes (Signed)
PT Cancellation Note  Patient Details Name: Nicky Pughmo R Hottinger MRN: [id number] DOB: 09/24/1933   Cancelled Treatment:    Reason Eval/Treat Not Completed: Patient not medically ready (Chart reviewed. PT evaluation held at this time due to Hb out of safe range for OOB/exersional activity as per [facility] policy and ACSM. ) Will monitor remotely and attempt again when patient is appropriate.   8:30 AM, 06/28/17 Rosamaria LintsAllan C Buccola, PT, DPT Physical Therapist -  [phone](ASCOM)  [phone] (mobile)   Buccola,Allan C 06/28/2017, 8:28 AM

## 2017-06-28 NOTE — Consult Note (Addendum)
Re:   Austin Dyer DOB:   09-12-1933 MRN:   [id number]  ASSESSMENT AND PLAN:  Stool guaiac positive  Upper GI bleed  Fall in home, initial encounter  Altered mental status, unspecified altered mental status type   Anemia secondary to gastrointestinal blood loss  Melena, suspect UGI source (PUD, gastritis, GAVE, etc.)  Plan-- 1. Continue IV protonix. 2. Serial H/H. 3. Serial examinations. 4.  EGD in AM. The patient understands the nature of the planned procedure, indications, risks, alternatives and potential complications including but not limited to bleeding, infection, perforation, damage to internal organs and possible oversedation/side effects from anesthesia. The patient agrees and gives consent to proceed.  Please refer to procedure notes for findings, recommendations and patient disposition/instructions.  Chief Complaint  Patient presents with  . Fall   REFERRING PHYSICIAN: Shamleffer, Austin Mellow, MD  HISTORY OF PRESENT ILLNESS: Austin Dyer is a 83 y.o. (DOB: [birth year])   male whose primary care physician is Dyer, Austin Mellow, MD and was admitted to the hospital yesterday  for acute GI bleed and anemia.  The patient apparently had a fall at home "because my right hip gave way" but also admits that he was dizzy and lightheaded just before his fall. The patient admits to black, tarry stool at an unknown frequency as patient is not a detailed historian. He claims he has never been hospitalized previously and has no major medical problems. The patient admits that his sister is still living but has a history of "some cancer" not further elucidated. Patient has never previously undergone colon cancer screening and would not be considered a candidate at this time due to his age, unless he elected to do so. The patient denies NSAID use. He takes only Tylenol PRN for headache.      Current Facility-Administered Medications  Medication Dose Route Frequency Provider  Last Rate Last Dose  . 0.9 %  sodium chloride infusion   Intravenous Continuous Austin Lovett, MD 75 mL/hr at 06/27/17 2151    . acetaminophen (TYLENOL) tablet 650 mg  650 mg Oral Q6H PRN Austin Lovett, MD       Or  . acetaminophen (TYLENOL) suppository 650 mg  650 mg Rectal Q6H PRN Austin Lovett, MD      . bisacodyl (DULCOLAX) EC tablet 5 mg  5 mg Oral Daily PRN Austin Lovett, MD      . docusate sodium (COLACE) capsule 100 mg  100 mg Oral BID Austin Lovett, MD   100 mg at 06/28/17 0913  . finasteride (PROSCAR) tablet 5 mg  5 mg Oral Daily Austin Lovett, MD   5 mg at 06/28/17 0913  . HYDROcodone-acetaminophen (NORCO/VICODIN) 5-325 MG per tablet 1 tablet  1 tablet Oral Once Austin Rumps, Austin Dyer   Stopped at 06/27/17 1300  . HYDROcodone-acetaminophen (NORCO/VICODIN) 5-325 MG per tablet 1-2 tablet  1-2 tablet Oral Q4H PRN Austin Lovett, MD      . ondansetron (ZOFRAN) tablet 4 mg  4 mg Oral Q6H PRN Austin Lovett, MD       Or  . ondansetron (ZOFRAN) injection 4 mg  4 mg Intravenous Q6H PRN Austin Lovett, MD      . pantoprazole (PROTONIX) 80 mg in sodium chloride 0.9 % 250 mL (0.32 mg/mL) infusion  8 mg/hr Intravenous Continuous Austin Semen, MD 25 mL/hr at 06/28/17 0540 8 mg/hr at 06/28/17 0540  . [START ON 07/01/2017] pantoprazole (PROTONIX) injection 40 mg  40 mg Intravenous Q12H Austin Semen, MD      .  traZODone (DESYREL) tablet 25 mg  25 mg Oral QHS PRN Austin LovettShah, Vipul, MD         No Known Allergies    Past Medical History:  Diagnosis Date  . BPH (benign prostatic hyperplasia)   . Chronic kidney disease    History of kidney stone   . Heart murmur   . Legally blind    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, Legally blind in Left eye  . Sleep apnea, obstructive   . Stroke (HCC)   . Ulnar nerve compression       Past Surgical History:  Procedure Laterality Date  . INGUINAL HERNIA REPAIR Bilateral   . SPINE SURGERY     Disk surgery        REVIEW OF SYSTEMS: Review of Systems  Constitutional:  Negative for chills, diaphoresis, fever, malaise/fatigue and weight loss.  HENT: Negative.   Eyes: Positive for discharge.  Respiratory: Negative.   Cardiovascular: Negative.   Gastrointestinal: Positive for blood in stool and melena. Negative for abdominal pain, constipation, diarrhea, heartburn, nausea and vomiting.  Musculoskeletal: Negative.   Skin: Negative.   Neurological: Positive for dizziness, weakness and headaches.  Endo/Heme/Allergies: Negative.   Psychiatric/Behavioral: Negative.      PHYSICAL EXAM:  Physical Exam  Constitutional: He is well-developed, well-nourished, and in no distress. No distress.  HENT:  Head: Normocephalic and atraumatic.  Eyes: Pupils are equal, round, and reactive to light. Right eye exhibits no discharge. Left eye exhibits no discharge.  Neck: Neck supple. No tracheal deviation present. No thyromegaly present.  Cardiovascular: Normal rate.  Pulmonary/Chest: Effort normal and breath sounds normal. He exhibits no tenderness.  Abdominal: He exhibits distension. He exhibits no mass. There is no tenderness. There is no rebound and no guarding.  Musculoskeletal: He exhibits no tenderness or deformity.  Neurological: He is alert.  Legally blind  Skin: Skin is warm and dry. He is not diaphoretic.  Psychiatric: His mood appears not anxious. He is not agitated. He does not express impulsivity. He does not exhibit a depressed mood. He expresses no homicidal ideation. He expresses no homicidal plans. He is not concerned with wish fulfillment. He exhibits ordered thought content and normal recent memory.    DATA REVIEWED: Hgb decreased to 6.5 from 8.9. Baseline Hgb was 14 from 2016

## 2017-06-29 ENCOUNTER — Encounter
Admission: EM | Disposition: A | Discharge status: Skilled Nursing Facility | Payer: Self-pay | Source: Home / Self Care | Attending: Internal Medicine

## 2017-06-29 ENCOUNTER — Inpatient Hospital Stay: Payer: Medicare Other | Admitting: Anesthesiology

## 2017-06-29 ENCOUNTER — Other Ambulatory Visit: Payer: Self-pay

## 2017-06-29 ENCOUNTER — Encounter: Payer: Self-pay | Admitting: Anesthesiology

## 2017-06-29 DIAGNOSIS — K922 Gastrointestinal hemorrhage, unspecified: Secondary | ICD-10-CM

## 2017-06-29 DIAGNOSIS — D62 Acute posthemorrhagic anemia: Secondary | ICD-10-CM

## 2017-06-29 HISTORY — PX: ESOPHAGOGASTRODUODENOSCOPY (EGD) WITH PROPOFOL: SHX5813

## 2017-06-29 LAB — HEMOGLOBIN AND HEMATOCRIT, BLOOD
HCT: 15.1 % — ABNORMAL LOW (ref 40.0–52.0)
HEMATOCRIT: 21.6 % — AB (ref 40.0–52.0)
HEMOGLOBIN: 6.9 g/dL — AB (ref 13.0–18.0)
Hemoglobin: 4.8 g/dL — CL (ref 13.0–18.0)

## 2017-06-29 LAB — PREPARE RBC (CROSSMATCH)

## 2017-06-29 LAB — MRSA PCR SCREENING: MRSA BY PCR: NEGATIVE

## 2017-06-29 LAB — OCCULT BLOOD X 1 CARD TO LAB, STOOL: Fecal Occult Bld: POSITIVE — AB

## 2017-06-29 LAB — GLUCOSE, CAPILLARY
Glucose-Capillary: 128 mg/dL — ABNORMAL HIGH (ref 65–99)
Glucose-Capillary: 136 mg/dL — ABNORMAL HIGH (ref 65–99)

## 2017-06-29 SURGERY — ESOPHAGOGASTRODUODENOSCOPY (EGD) WITH PROPOFOL
Anesthesia: General

## 2017-06-29 MED ORDER — PHENYLEPHRINE HCL 10 MG/ML IJ SOLN
INTRAMUSCULAR | Status: AC
  Filled 2017-06-29: qty 1

## 2017-06-29 MED ORDER — ATROPINE SULFATE 1 MG/ML IJ SOLN
INTRAMUSCULAR | Status: DC | PRN
  Administered 2017-06-29: 1 mg via INTRAVENOUS

## 2017-06-29 MED ORDER — PROPOFOL 500 MG/50ML IV EMUL
INTRAVENOUS | Status: AC
  Filled 2017-06-29: qty 50

## 2017-06-29 MED ORDER — PREDNISOLONE ACETATE 1 % OP SUSP
1.0000 [drp] | Freq: Two times a day (BID) | OPHTHALMIC | Status: DC
  Administered 2017-06-29 – 2017-07-04 (×11): 1 [drp] via OPHTHALMIC
  Filled 2017-06-29: qty 1

## 2017-06-29 MED ORDER — EPINEPHRINE PF 1 MG/10ML IJ SOSY
PREFILLED_SYRINGE | INTRAMUSCULAR | Status: AC
  Filled 2017-06-29: qty 10

## 2017-06-29 MED ORDER — DEXTROSE IN LACTATED RINGERS 5 % IV SOLN
INTRAVENOUS | Status: DC
  Administered 2017-06-29 – 2017-06-30 (×3): via INTRAVENOUS
  Filled 2017-06-29: qty 1000

## 2017-06-29 MED ORDER — ATROPINE SULFATE 1 MG/10ML IJ SOSY
PREFILLED_SYRINGE | INTRAMUSCULAR | Status: AC
  Filled 2017-06-29: qty 10

## 2017-06-29 MED ORDER — METOPROLOL TARTRATE 5 MG/5ML IV SOLN
INTRAVENOUS | Status: DC | PRN
  Administered 2017-06-29: 5 mg via INTRAVENOUS

## 2017-06-29 MED ORDER — SODIUM CHLORIDE 0.9 % IJ SOLN
INTRAMUSCULAR | Status: AC
  Filled 2017-06-29: qty 10

## 2017-06-29 MED ORDER — PHENYLEPHRINE HCL 10 MG/ML IJ SOLN
INTRAMUSCULAR | Status: DC | PRN
  Administered 2017-06-29 (×3): 200 ug via INTRAVENOUS

## 2017-06-29 MED ORDER — LIDOCAINE HCL (PF) 2 % IJ SOLN
INTRAMUSCULAR | Status: AC
  Filled 2017-06-29: qty 10

## 2017-06-29 MED ORDER — EPINEPHRINE PF 1 MG/10ML IJ SOSY
PREFILLED_SYRINGE | INTRAMUSCULAR | Status: DC | PRN
  Administered 2017-06-29: 1 mg via INTRAVENOUS

## 2017-06-29 MED ORDER — LACTATED RINGERS IV SOLN
INTRAVENOUS | Status: DC | PRN
  Administered 2017-06-29: 18:00:00 via INTRAVENOUS

## 2017-06-29 MED ORDER — PROPOFOL 10 MG/ML IV BOLUS
INTRAVENOUS | Status: DC | PRN
  Administered 2017-06-29 (×7): 50 mg via INTRAVENOUS

## 2017-06-29 MED ORDER — SODIUM CHLORIDE 0.9 % IV BOLUS (SEPSIS)
1000.0000 mL | Freq: Once | INTRAVENOUS | Status: AC
Start: 2017-06-29 — End: 2017-06-29
  Administered 2017-06-29: 1000 mL via INTRAVENOUS

## 2017-06-29 MED ORDER — BRINZOLAMIDE 1 % OP SUSP
1.0000 [drp] | Freq: Two times a day (BID) | OPHTHALMIC | Status: DC
  Administered 2017-06-29 – 2017-07-04 (×11): 1 [drp] via OPHTHALMIC
  Filled 2017-06-29: qty 10

## 2017-06-29 MED ORDER — BRIMONIDINE TARTRATE 0.2 % OP SOLN
1.0000 [drp] | Freq: Two times a day (BID) | OPHTHALMIC | Status: DC
  Administered 2017-06-29 – 2017-07-04 (×11): 1 [drp] via OPHTHALMIC
  Filled 2017-06-29: qty 5

## 2017-06-29 MED ORDER — SODIUM CHLORIDE 0.9 % IV SOLN
Freq: Once | INTRAVENOUS | Status: AC
Start: 2017-06-29 — End: 2017-06-29
  Administered 2017-06-29: 09:00:00 via INTRAVENOUS

## 2017-06-29 MED ORDER — LIDOCAINE HCL (CARDIAC) 20 MG/ML IV SOLN
INTRAVENOUS | Status: DC | PRN
  Administered 2017-06-29: 100 mg via INTRAVENOUS

## 2017-06-29 NOTE — Progress Notes (Signed)
PT Cancellation Note  Patient Details Name: Nicky Pughmo R Skarda MRN: [id number] DOB: 10/10/1933   Cancelled Treatment:    Reason Eval/Treat Not Completed: (Pt.'s chart reviewed; pt. is not medically appropriate). Per chart review, patient's hemoglobin is 4.8, which contraindicates their PT evaluation at this time. Will perform evaluation when patient is appropriate for PT.    Katherina Rightatricia P Jazell Rosenau, PT 06/29/2017, 8:45 AM

## 2017-06-29 NOTE — Anesthesia Post-op Follow-up Note (Signed)
Anesthesia QCDR form completed.        

## 2017-06-29 NOTE — Transfer of Care (Signed)
Immediate Anesthesia Transfer of Care Note  Patient: Austin Dyer  Procedure(s) Performed: ESOPHAGOGASTRODUODENOSCOPY (EGD) WITH PROPOFOL (N/A )  Patient Location: ICU  Anesthesia Type:General  Level of Consciousness: awake and alert   Airway & Oxygen Therapy: Patient Spontanous Breathing and Patient connected to nasal cannula oxygen  Post-op Assessment: Report given to RN and Post -op Vital signs reviewed and stable  Post vital signs: Reviewed and stable  Last Vitals:  Vitals:   06/29/17 1600 06/29/17 1734  BP: 114/69 121/71  Pulse: (!) 107 (!) 107  Resp: 18 16  Temp: 36.7 C 37.4 C  SpO2: 100% 98%    Last Pain:  Vitals:   06/29/17 1734  TempSrc: Tympanic  PainSc:       Patients Stated Pain Goal: 0 (06/29/17 1525)  Complications: No apparent anesthesia complications

## 2017-06-29 NOTE — Anesthesia Postprocedure Evaluation (Signed)
Anesthesia Post Note  Patient: Austin Dyer  Procedure(s) Performed: ESOPHAGOGASTRODUODENOSCOPY (EGD) WITH PROPOFOL (N/A )  Patient location during evaluation: ICU Anesthesia Type: General Level of consciousness: awake and alert and oriented Pain management: pain level controlled Vital Signs Assessment: post-procedure vital signs reviewed and stable Respiratory status: spontaneous breathing Cardiovascular status: blood pressure returned to baseline Comments: Patient brayed down after the case had ended to asystole.  Chest compressions, epi and atropine and ETT secured resulted in return of cardiac function and good sats around 98-100 %.  Patient shortly extubated in the ICU with the return of lucid direct answers to questions and maintained sats on O2.  Suspect blood loss and demand ischemia made the patient more prone to severe bradycardia.     Last Vitals:  Vitals:   06/29/17 1850 06/29/17 1900  BP:  (!) 102/58  Pulse:  (!) 101  Resp:  (!) 25  Temp:    SpO2: 99% 100%    Last Pain:  Vitals:   06/29/17 1734  TempSrc: Tympanic  PainSc:                  [name]

## 2017-06-29 NOTE — Progress Notes (Signed)
[employer] [facility] [place] [facility]   PATIENT NAME: Austin Dyer    MR#:  [id number]  DATE OF BIRTH:  03/16/1934  SUBJECTIVE:  CHIEF COMPLAINT:   Chief Complaint  Patient presents with  . Fall    Came after a fall and weakness, found to have GI bleed. His MRI on hip is negative for fracture. Significant drop in Hb today.  REVIEW OF SYSTEMS:  CONSTITUTIONAL: No fever, positive for fatigue or weakness.  EYES: No blurred or double vision.  EARS, NOSE, AND THROAT: No tinnitus or ear pain.  RESPIRATORY: No cough, shortness of breath, wheezing or hemoptysis.  CARDIOVASCULAR: No chest pain, orthopnea, edema.  GASTROINTESTINAL: No nausea, vomiting, diarrhea or abdominal pain.  GENITOURINARY: No dysuria, hematuria.  ENDOCRINE: No polyuria, nocturia,  HEMATOLOGY: No anemia, easy bruising or bleeding SKIN: No rash or lesion. MUSCULOSKELETAL: No joint pain or arthritis.   NEUROLOGIC: No tingling, numbness, weakness.  PSYCHIATRY: No anxiety or depression.   ROS  DRUG ALLERGIES:  No Known Allergies  VITALS:  Blood pressure 117/61, pulse 99, temperature (!) 100.4 F (38 C), temperature source Oral, resp. rate 18, height 5\' 8"  (1.727 m), weight 93.9 kg (207 lb), SpO2 98 %.  PHYSICAL EXAMINATION:  GENERAL:  83 y.o.-year-old patient lying in the bed with no acute distress.  EYES: Pupils equal, round, reactive to light and accommodation. No scleral icterus. Extraocular muscles intact.  HEENT: Head atraumatic, normocephalic. Oropharynx and nasopharynx clear.  NECK:  Supple, no jugular venous distention. No thyroid enlargement, no tenderness.  LUNGS: Normal breath sounds bilaterally, no wheezing, rales,rhonchi or crepitation. No use of accessory muscles of respiration.  CARDIOVASCULAR: S1, S2 normal. No murmurs, rubs, or gallops.  ABDOMEN: Soft, nontender, nondistended. Bowel sounds present. No organomegaly or mass.  EXTREMITIES: No pedal edema, cyanosis, or clubbing.   NEUROLOGIC: Cranial nerves II through XII are intact. Muscle strength 4/5 in all extremities. Sensation intact. Gait not checked.  PSYCHIATRIC: The patient is alert and oriented x 3.  SKIN: No obvious rash, lesion, or ulcer.   Physical Exam LABORATORY PANEL:   CBC  Recent Labs Lab 06/28/17 0626  WBC 16.6*  HGB 6.5*  HCT 20.5*  PLT 282   ------------------------------------------------------------------------------------------------------------------  Chemistries   Recent Labs Lab 06/27/17 1332 06/28/17 0626  NA 140 143  K 4.4 4.1  CL 109 114*  CO2 24 22  GLUCOSE 131* 103*  BUN 93* 95*  CREATININE 1.95* 1.81*  CALCIUM 8.9 8.6*  AST 17  --   ALT 12*  --   ALKPHOS 61  --   BILITOT 0.5  --    ------------------------------------------------------------------------------------------------------------------  Cardiac Enzymes  Recent Labs Lab 06/27/17 1332  TROPONINI 0.04*   ------------------------------------------------------------------------------------------------------------------  RADIOLOGY:  Ct Head Wo Contrast  Result Date: 06/27/2017 CLINICAL DATA:  Patient fell last evening with multiple falls recently. Confusion and neck pain following fall. EXAM: CT HEAD WITHOUT CONTRAST CT CERVICAL SPINE WITHOUT CONTRAST TECHNIQUE: Multidetector CT imaging of the head and cervical spine was performed following the standard protocol without intravenous contrast. Multiplanar CT image reconstructions of the cervical spine were also generated. COMPARISON:  11/04/2011 MRI of the cervical spine FINDINGS: CT HEAD FINDINGS BRAIN: There is mild sulcal and mild to moderate ventricular prominence consistent with superficial and central atrophy. No intraparenchymal hemorrhage, mass effect nor midline shift. Periventricular and subcortical white matter hypodensities consistent with chronic small vessel ischemic disease are identified. Idiopathic basal ganglial calcifications are noted  bilaterally. No acute large vascular territory  infarcts. No abnormal extra-axial fluid collections. Basal cisterns are not effaced and midline. VASCULAR: Moderate calcific atherosclerosis of the carotid siphons. SKULL: No skull fracture. No significant scalp soft tissue swelling. SINUSES/ORBITS: The mastoid air-cells are clear. The included paranasal sinuses are well-aerated.The included ocular globes and orbital contents are non-suspicious. OTHER: None. CT CERVICAL SPINE FINDINGS Alignment: Intact atlantodental and craniocervical relationships. Normal cervical lordosis. Skull base and vertebrae: Incomplete posterior arch of C1, developmental in appearance. Subcortical cystic change of the anterior odontoid body. No acute cervical spine fracture. Intact skullbase. Soft tissues and spinal canal: No prevertebral fluid or swelling. No visible canal hematoma. Disc levels: C2-C3: No significant central or neural foraminal encroachment. No focal disc herniation. C3-C4: Partially calcified mild disc bulge. No significant neural foraminal or central canal stenosis. C4-C5: Mild partially calcified disc bulge without focal disc herniation, canal stenosis or neural foraminal encroachment. Mild left C4-5 uncinate process hypertrophy. C5-C6: No focal disc herniation, canal stenosis or significant neural foraminal encroachment. Small anterior osteophytes. Mild left-sided uncinate process hypertrophy. C6-C7: Small anterior osteophytes. No focal disc herniation, canal stenosis or significant neural foraminal encroachment. C7-T1:  Negative Upper chest: Left apical scarring. Other: No jumped or perched facets. IMPRESSION: 1. No acute intracranial nor cervical spine abnormality. 2. Chronic small vessel ischemic disease periventricular and subcortical white matter. 3. Mild cervical spondylosis. Electronically Signed   By: Tollie Eth M.D.   On: 06/27/2017 13:25   Ct Cervical Spine Wo Contrast  Result Date: 06/27/2017 CLINICAL DATA:   Patient fell last evening with multiple falls recently. Confusion and neck pain following fall. EXAM: CT HEAD WITHOUT CONTRAST CT CERVICAL SPINE WITHOUT CONTRAST TECHNIQUE: Multidetector CT imaging of the head and cervical spine was performed following the standard protocol without intravenous contrast. Multiplanar CT image reconstructions of the cervical spine were also generated. COMPARISON:  11/04/2011 MRI of the cervical spine FINDINGS: CT HEAD FINDINGS BRAIN: There is mild sulcal and mild to moderate ventricular prominence consistent with superficial and central atrophy. No intraparenchymal hemorrhage, mass effect nor midline shift. Periventricular and subcortical white matter hypodensities consistent with chronic small vessel ischemic disease are identified. Idiopathic basal ganglial calcifications are noted bilaterally. No acute large vascular territory infarcts. No abnormal extra-axial fluid collections. Basal cisterns are not effaced and midline. VASCULAR: Moderate calcific atherosclerosis of the carotid siphons. SKULL: No skull fracture. No significant scalp soft tissue swelling. SINUSES/ORBITS: The mastoid air-cells are clear. The included paranasal sinuses are well-aerated.The included ocular globes and orbital contents are non-suspicious. OTHER: None. CT CERVICAL SPINE FINDINGS Alignment: Intact atlantodental and craniocervical relationships. Normal cervical lordosis. Skull base and vertebrae: Incomplete posterior arch of C1, developmental in appearance. Subcortical cystic change of the anterior odontoid body. No acute cervical spine fracture. Intact skullbase. Soft tissues and spinal canal: No prevertebral fluid or swelling. No visible canal hematoma. Disc levels: C2-C3: No significant central or neural foraminal encroachment. No focal disc herniation. C3-C4: Partially calcified mild disc bulge. No significant neural foraminal or central canal stenosis. C4-C5: Mild partially calcified disc bulge  without focal disc herniation, canal stenosis or neural foraminal encroachment. Mild left C4-5 uncinate process hypertrophy. C5-C6: No focal disc herniation, canal stenosis or significant neural foraminal encroachment. Small anterior osteophytes. Mild left-sided uncinate process hypertrophy. C6-C7: Small anterior osteophytes. No focal disc herniation, canal stenosis or significant neural foraminal encroachment. C7-T1:  Negative Upper chest: Left apical scarring. Other: No jumped or perched facets. IMPRESSION: 1. No acute intracranial nor cervical spine abnormality. 2. Chronic small vessel ischemic disease periventricular  and subcortical white matter. 3. Mild cervical spondylosis. Electronically Signed   By: Tollie Ethavid  Kwon M.D.   On: 06/27/2017 13:25   Mr Hip Right Wo Contrast  Result Date: 06/28/2017 CLINICAL DATA:  Acute right hip pain EXAM: MR OF THE RIGHT HIP WITHOUT CONTRAST TECHNIQUE: Multiplanar, multisequence MR imaging was performed. No intravenous contrast was administered. COMPARISON:  Radiographs from 06/27/2017 FINDINGS: Bones: No abnormal marrow edema or other findings to suggest acute fracture involving the right hip or regional pelvis. Spur like projection medially along the proximal metaphysis of the right proximal femur appears chronic and may represent a spur related to the hip adductor musculature or conceivably a chronic osteochondroma. There is evidence of lumbar spondylosis and degenerative disc disease. Articular cartilage and labrum Articular cartilage: Preserved thickness, no appreciable focal defect. Labrum:  Grossly intact Joint or bursal effusion Joint effusion:  Absent Bursae:  No regional bursitis. Muscles and tendons Muscles and tendons: Mild abnormal edema laterally in the gluteus maximus, images 16-24 of series 7, probably reflecting a muscle strain. Mild proximal tendinopathy in the right hamstring tendons. Subtle edema medially in both the right and left hip adductor musculature  for example on image 15/7 which may reflect muscle strain. Other findings Miscellaneous: The prostate gland measures 7.4 by 8.8 by 8.8 cm (volume = 300 cm^3) and demonstrates internal heterogeneity in the central zone characteristic of benign prostatic hypertrophy, although a low T2 signal 2.5 by 2.1 by 2.6 cm (volume = 7.1 cm^3) focus in the right transitional zone is nonspecific with respect to malignancy. On image 19/4 there appear to be cystic lesions from the lower poles of the kidneys, similar findings were shown on the CT abdomen from 10/29/2011 and accordingly I doubt that this requires further workup. IMPRESSION: 1. No fracture or acute bony finding. 2. Strain of the lateral portion of the right gluteus maximus, potentially acute. There is also subtle edema medially in the adductor musculature both hips which could reflect a mild sprain. 3. Markedly enlarged prostate gland with volume estimated at 300 cc. No specific findings of malignancy although there is a low T2 signal nodule in the right transition zone which is nonspecific. 4. Osteochondroma or spur along the attachment site of some of the adductor musculature to the right femur, this is felt to be chronic. 5. Lumbar spondylosis and degenerative disc disease. These results will be called to the ordering clinician or representative by the Radiologist Assistant, and communication documented in the PACS or zVision Dashboard. Electronically Signed   By: Gaylyn RongWalter  Liebkemann M.D.   On: 06/28/2017 08:03   Dg Hip Unilat W Or Wo Pelvis 2-3 Views Right  Result Date: 06/27/2017 CLINICAL DATA:  Right hip pain after fall yesterday. EXAM: DG HIP (WITH OR WITHOUT PELVIS) 2-3V RIGHT COMPARISON:  CT abdomen and pelvis dated October 29, 2011. FINDINGS: No acute fracture or malalignment. The bilateral hip joint spaces are relatively preserved. Probable small osteochondroma arising from the right medial femoral neck, unchanged. The sacroiliac joints and pubic symphysis  are intact. Degenerative changes of the lower lumbar spine. IMPRESSION: No acute fracture or malalignment. If occult hip fracture is suspected or if the patient is unable to bear weight, MRI is the preferred modality for further evaluation. Electronically Signed   By: Obie DredgeWilliam T Derry M.D.   On: 06/27/2017 14:20    ASSESSMENT AND PLAN:   Active Problems:   Hip pain   UGIB (upper gastrointestinal bleed)   *Right hip pain -X-ray is negative any acute  pathology -Physical therapy evaluation, may need placement - MRI - no fracture but have muscle sprain, may need PT and Muscle relaxors.  *Melena/GI blood loss -Stool Hemoccult was positive in the ED - consult GI -Monitor hemoglobin  * anemia ac on ch due to blood loss   Transfuse one unit blood.   discussed with pt about possible side effects of blood transfusion- including more common like low grade fever to rare and serious like renal and lung involvement and infections.  He understood- and due to necessity of transfusion- agreed to receive the transfusion.   *Acute renal failure -IV hydration and monitor creatinine  *BPH -Continue tamsulosin    All the records are reviewed and case discussed with Care Management/Social Workerr. Management plans discussed with the patient, family and they are in agreement.  CODE STATUS: Full.  TOTAL TIME TAKING CARE OF THIS PATIENT: 35 minutes.  Spoke to his brother and sister in room.   POSSIBLE D/C IN 1-2 DAYS, DEPENDING ON CLINICAL CONDITION.   Altamese Dilling M.D on 06/29/2017   Between 7am to 6pm - Pager - [phone]  After 6pm go to [email or web address] - Social research officer, government  [facility]  Office  [phone]  CC: Primary care physician; Shamleffer, Landry Mellow, MD  Note: This dictation was prepared with Dragon dictation along with smaller phrase technology. Any transcriptional errors that result from this process are unintentional.

## 2017-06-29 NOTE — Anesthesia Preprocedure Evaluation (Addendum)
Anesthesia Evaluation  Patient identified by MRN, date of birth, ID band Patient awake    Reviewed: Allergy & Precautions, NPO status , Patient's Chart, lab work & pertinent test results  Airway Mallampati: II  TM Distance: >3 FB Neck ROM: Full    Dental  (+) Poor Dentition, Caps, Chipped   Pulmonary sleep apnea ,    + rhonchi        Cardiovascular negative cardio ROS  + Valvular Problems/Murmurs      Neuro/Psych  Neuromuscular disease CVA negative psych ROS   GI/Hepatic GI bleed   Endo/Other    Renal/GU Renal InsufficiencyRenal disease  negative genitourinary   Musculoskeletal  (+) Arthritis , Osteoarthritis,    Abdominal (+) + obese,   Peds negative pediatric ROS (+)  Hematology  (+) anemia ,   Anesthesia Other Findings Past Medical History: No date: BPH (benign prostatic hyperplasia) No date: Chronic kidney disease     Comment:  History of kidney stone  No date: Heart murmur No date: Legally blind     Comment:  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, Legally blind in Left eye No date: Sleep apnea, obstructive No date: Stroke (HCC) No date: Ulnar nerve compression  Reproductive/Obstetrics                          Anesthesia Physical Anesthesia Plan  ASA: III and emergent  Anesthesia Plan: General   Post-op Pain Management:    Induction: Intravenous  PONV Risk Score and Plan: 2  Airway Management Planned: Nasal Cannula  Additional Equipment:   Intra-op Plan:   Post-operative Plan:   Informed Consent: I have reviewed the patients History and Physical, chart, labs and discussed the procedure including the risks, benefits and alternatives for the proposed anesthesia with the patient or authorized representative who has indicated his/her understanding and acceptance.   Dental advisory given  Plan Discussed with: CRNA and Surgeon  Anesthesia Plan Comments:        Anesthesia  Quick Evaluation

## 2017-06-29 NOTE — Progress Notes (Signed)
Rt assisted anesthesia with extubating patient. Placed on 4l [place] with sats maintaining 99-100%.

## 2017-06-29 NOTE — Progress Notes (Signed)
CH responded to a Code Blue in Endo-4. Pt was revived by the medical team and the code was canceled. No family was available at the time. Pt was later returned to IC-03. CH is available for follow up as needed.    06/29/17 1900  Clinical Encounter Type  Visited With Health care provider  Visit Type Initial;Spiritual support;Code (Code Blue)  Referral From Nurse  Consult/Referral To [facility]. du PontChaplain

## 2017-06-29 NOTE — Plan of Care (Signed)
Lab called critical Hgb of 4.8.  Paged Dr. Amado CoeGouru and texted her.  She returned my call and came to the floor.  She ordered 2 u PRBC.  Blood was available and was started at 0923.  Thought I had lost an IV which was needed b/c pt also had protonix drip going.  Selena BattenKim, [facility] was in the room starting an IV when pt started heaving and vomited blood.  Dr. Amado CoeGouru was still on the floor and informed.  She came to the room and assessed the blood and stated she wanted the pt moved to ICU.  Kim called ICU to let them know and a bed was quickly obtained and pt moved upstairs.  He had been scheduled for an endoscopy this am, but when Hgb was 4.8, I called to let them know, and they sd they'd reassess after he rec'd the blood.  Dr. Amado CoeGouru was in communication with Endo and GI - Dr. Norma Fredricksonoledo.  Went with pt to ICU and gave bedside report to Melvilleheryl, Charity fundraiserN.

## 2017-06-29 NOTE — Progress Notes (Addendum)
Patient and report received from 1C RN Corrie DandyMary who accompanied him to ICU.  He is drowsy but easily arouseable, does not easily recall where he is, the situation, or time.  Family reports this is his baseline d/t childhood incidence of rheumatic fever?  They state that he "still signs all his own paperwork."  First unit of PRBCs transfusing on transfer, second unit transfusing now.  VSS on room air.  Pt has a headache, will medicate as appropriate.  Recheck H&H 1 hr post transfusion complete.  Pt will go for EGD tonight if H&H is improved per Dr Norma Fredricksonoledo.  EGD consent is signed.

## 2017-06-29 NOTE — Anesthesia Procedure Notes (Signed)
Procedure Name: Intubation Date/Time: 06/29/2017 6:09 PM Performed by: Alvin Critchley, MD Pre-anesthesia Checklist: Patient identified, Emergency Drugs available, Suction available, Timeout performed and Patient being monitored Patient Re-evaluated:Patient Re-evaluated prior to induction Oxygen Delivery Method: Circle system utilized Preoxygenation: Pre-oxygenation with 100% oxygen Induction Type: IV induction Laryngoscope Size: Mac and 4 Grade View: Grade I Tube type: Oral Tube size: 7.5 mm Placement Confirmation: ETT inserted through vocal cords under direct vision,  positive ETCO2,  CO2 detector and breath sounds checked- equal and bilateral Secured at: 23 cm Tube secured with: Tape Dental Injury: Teeth and Oropharynx as per pre-operative assessment

## 2017-06-29 NOTE — Progress Notes (Addendum)
MD notified at 0304 of pt having large bm that was very dark and tarry. Pt very lethargic and bp 99/46. MD ordered to give ns bolus and recheck bp after bolus and check stool and recheck hgb with am labs. Orders placed when epic back up

## 2017-06-29 NOTE — Op Note (Signed)
[facility] Gastroenterology Patient Name: Austin Dyer Procedure Date: 06/29/2017 5:32 PM MRN: [id number] Account #: [id number] Date of Birth: 06-Jul-1934 Admit Type: Inpatient Age: 83 Room: [facility] ENDO ROOM 4 Gender: Male Note Status: Finalized Procedure:            Upper GI endoscopy Indications:          Recent gastrointestinal bleeding, Iron deficiency                        anemia due to suspected upper gastrointestinal bleeding Providers:            Boykin Nearing. Norma Fredrickson MD, MD Referring MD:         Landry Mellow [facility] (Referring MD) Medicines:            Propofol per Anesthesia Complications:        No immediate complications. Procedure:            Pre-Anesthesia Assessment:                       - The risks and benefits of the procedure and the                        sedation options and risks were discussed with the                        patient. All questions were answered and informed                        consent was obtained.                       - Patient identification and proposed procedure were                        verified prior to the procedure by the nurse. The                        procedure was verified in the procedure room.                       - ASA Grade Assessment: III - A patient with severe                        systemic disease.                       - After reviewing the risks and benefits, the patient                        was deemed in satisfactory condition to undergo the                        procedure.                       - The risks and benefits of the procedure and the                        sedation options and risks were discussed with the  patient. All questions were answered and informed                        consent was obtained.                       - Patient identification and proposed procedure were                        verified prior to the procedure by the nurse. The             procedure was verified in the procedure room.                       - ASA Grade Assessment: III - A patient with severe                        systemic disease.                       After obtaining informed consent, the endoscope was                        passed under direct vision. Throughout the procedure,                        the patient's blood pressure, pulse, and oxygen                        saturations were monitored continuously. The                        Colonoscope was introduced through the mouth, and                        advanced to the third part of duodenum. The upper GI                        endoscopy was accomplished without difficulty. The                        patient tolerated the procedure well. Findings:      The examined esophagus was normal.      Localized moderately congested mucosa without active bleeding and with       no stigmata of bleeding was found in the duodenal bulb.      Red blood was found in the gastric fundus.      One oozing cratered gastric ulcer with a visible vessel was found in the       cardia. The lesion was 15 mm in largest dimension. Area was successfully       injected with 3 mL of a 1:10,000 solution of epinephrine for hemostasis.       Coagulation for hemostasis using bipolar probe was successful. 10 Fr       Gold probe used with injection through a Therapeutic gastroscope Impression:           - Normal esophagus.                       - Congested duodenal mucosa.                       -  Red blood in the gastric fundus.                       - Oozing gastric ulcer with a visible vessel. Injected.                        Treated with bipolar cautery.                       - No specimens collected. Recommendation:       - Return patient to ICU for ongoing care.                       - NPO.                       - Please refer to anesthesia record regarding CODE BLUE                        called for agonal breathing and  bradycardia without a                        pulse. After CPR and intubation with Mechanical                        ventilation was begun, patient regained pulse and was                        faintly arousable. Plan is to transfer directly to ICU.                       - Transfer patient to ICU. Procedure Code(s):    --- Professional ---                       [id number], Esophagogastroduodenoscopy, flexible, transoral;                        with control of bleeding, any method Diagnosis Code(s):    --- Professional ---                       K31.89, Other diseases of stomach and duodenum                       K92.2, Gastrointestinal hemorrhage, unspecified                       K25.4, Chronic or unspecified gastric ulcer with                        hemorrhage                       D50.9, Iron deficiency anemia, unspecified CPT copyright 2016 [employer]. All rights reserved. The codes documented in this report are preliminary and upon coder review may  be revised to meet current compliance requirements. Stanton Kidneyeodoro K Wilmina Maxham MD, MD 06/29/2017 6:19:50 PM This report has been signed electronically. Number of Addenda: 0 Note Initiated On: 06/29/2017 5:32 PM Estimated Blood Loss: Estimated blood loss was minimal.      [facility]

## 2017-06-29 NOTE — Consult Note (Signed)
PULMONARY / CRITICAL CARE MEDICINE   Name: Austin Dyer R Tweed MRN: [id number] DOB: 01/27/1934    ADMISSION DATE:  06/27/2017 CONSULTATION DATE:  11/04/`8  PT PROFILE:   [age] M who lives independently admitted 11/02 via ED after fall with noted melena and anemia. EGD planned. Transferred to SDU AM 11/04 for Hgb 4.5  MAJOR EVENTS/TEST RESULTS: 11/02 Admission as above 11/03 GI Consult: Continue IV protonix. Serial H/H. Serial examinations. EGD in AM 11/04 Transferred to SDU. 2 units PRBCs ordered   INDWELLING DEVICES::   MICRO DATA: MRSA PCR 11/04 >>   ANTIMICROBIALS:  None  PAST MEDICAL HISTORY :  He  has a past medical history of BPH (benign prostatic hyperplasia), Chronic kidney disease, Heart murmur, Legally blind, Sleep apnea, obstructive, Stroke (HCC), and Ulnar nerve compression.  PAST SURGICAL HISTORY: He  has a past surgical history that includes Spine surgery and Inguinal hernia repair (Bilateral).  No Known Allergies  No current facility-administered medications on file prior to encounter.    Current Outpatient Medications on File Prior to Encounter  Medication Sig  . Brinzolamide-Brimonidine (SIMBRINZA) 1-0.2 % SUSP Apply 1 drop to eye. Place 1 (drop) into affected eye three times a day  . finasteride (PROSCAR) 5 MG tablet Take 5 mg by mouth daily.  . prednisoLONE acetate (PRED FORTE) 1 % ophthalmic suspension 1 drop 4 (four) times daily.  . tamsulosin (FLOMAX) 0.4 MG CAPS capsule Take 0.4 mg by mouth daily. After the same meal each day.  Marland Kitchen. acetaminophen (TYLENOL) 325 MG tablet Take 650 mg by mouth every 6 (six) hours as needed. For pain    FAMILY HISTORY:  His indicated that his mother is deceased. He indicated that his father is deceased. He indicated that his sister is alive. He indicated that his brother is alive.   SOCIAL HISTORY: He  reports that  has never smoked. he has never used smokeless tobacco. He reports that he does not drink alcohol or use  drugs.  REVIEW OF SYSTEMS:   Denies pain, dyspnea  SUBJECTIVE:    VITAL SIGNS: BP (!) 88/58   Pulse (!) 106   Temp 98.2 F (36.8 C) (Oral)   Resp 20   Ht 5\' 8"  (1.727 m)   Wt 96.4 kg (212 lb 8.4 oz)   SpO2 96%   BMI 32.31 kg/m   HEMODYNAMICS:    VENTILATOR SETTINGS:    INTAKE / OUTPUT: I/O last 3 completed shifts: In: 1483.3 [P.O.:600; I.V.:613.3; Blood:270] Out: 1300 [Urine:1300]  PHYSICAL EXAMINATION:  Gen: WDWN in NAD HEENT: NCAT, sclerae white, oropharynx normal Neck: No LAN, no JVD noted Lungs: full BS, normal percussion note, no adventitious sounds Cardiovascular: Reg, no M noted Abdomen: Soft, NT, +BS Ext: no C/C/E Neuro: PERRL, EOMI, motor/sensory grossly intact Skin: No lesions noted   LABS:  BMET Recent Labs  Lab 06/27/17 1332 06/28/17 0626  NA 140 143  K 4.4 4.1  CL 109 114*  CO2 24 22  BUN 93* 95*  CREATININE 1.95* 1.81*  GLUCOSE 131* 103*    Electrolytes Recent Labs  Lab 06/27/17 1332 06/28/17 0626  CALCIUM 8.9 8.6*    CBC Recent Labs  Lab 06/27/17 1332 06/28/17 0626 06/29/17 0750  WBC 15.6* 16.6*  --   HGB 8.9* 6.5* 4.8*  HCT 27.1* 20.5* 15.1*  PLT 307 282  --     Coag's No results for input(s): APTT, INR in the last 168 hours.  Sepsis Markers No results for input(s): LATICACIDVEN, PROCALCITON, O2SATVEN in  the last 168 hours.  ABG No results for input(s): PHART, PCO2ART, PO2ART in the last 168 hours.  Liver Enzymes Recent Labs  Lab 06/27/17 1332  AST 17  ALT 12*  ALKPHOS 61  BILITOT 0.5  ALBUMIN 3.0*    Cardiac Enzymes Recent Labs  Lab 06/27/17 1332  TROPONINI 0.04*    Glucose Recent Labs  Lab 06/28/17 0734 06/28/17 1141 06/28/17 1727 06/28/17 2110 06/29/17 0738 06/29/17 1010  GLUCAP 95 109* 105* 105* 128* 136*    CXR: no recent film  ASSESSMENT / PLAN: Acute blood loss anemia Melena Likely UGIB  Agree with transfusion of RBCs as ordered Cont PPI infusion  IVFs adjusted EGD  planned - if performed in ICU/SDU, I will be available for any hemodynamic or airway issues    Billy Fischer, MD PCCM service Mobile [phone] Pager [phone] 06/29/2017 11:08 AM

## 2017-06-29 NOTE — Progress Notes (Signed)
PT Cancellation Note  Patient Details Name: Austin Dyer MRN: [id number] DOB: 04/22/1934   Cancelled Treatment:    Reason Eval/Treat Not Completed: Medical issues which prohibited therapy(Per chart review, patient transferred to CCU due to change in medical status.  Per policy, will require new orders to resume PT services.  Please re-consult as medically appropriate.)  Upton Russey H. Manson PasseyBrown, PT, DPT, NCS 06/29/17, 10:21 PM [phone]

## 2017-06-29 NOTE — Progress Notes (Addendum)
[employer] [facility] [facility] [place] [facility]   PATIENT NAME: Austin Dyer    MR#:  [id number]  DATE OF BIRTH:  Mar 17, 1934  SUBJECTIVE:  CHIEF COMPLAINT:   Chief Complaint  Patient presents with  . Fall    Came after a fall and weakness, found to have GI bleed. RN is reporting black stool, hemoglobin is at 4.8 today after receiving blood transfusion 1 unit yesterday  His MRI on hip is negative for fracture. Patient is reporting headache. Denies any chest pain or shortness of breath. Denies any abdominal pain  REVIEW OF SYSTEMS:  CONSTITUTIONAL: No fever, positive for fatigue or weakness. Reporting headache EYES: No blurred or double vision.  EARS, NOSE, AND THROAT: No tinnitus or ear pain.  RESPIRATORY: No cough, shortness of breath, wheezing or hemoptysis.  CARDIOVASCULAR: No chest pain, orthopnea, edema.  GASTROINTESTINAL: No nausea, vomiting, diarrhea or abdominal pain.  GENITOURINARY: No dysuria, hematuria.  ENDOCRINE: No polyuria, nocturia,  HEMATOLOGY: No anemia, easy bruising or bleeding SKIN: No rash or lesion. MUSCULOSKELETAL: No joint pain or arthritis.   NEUROLOGIC: No tingling, numbness, weakness.  PSYCHIATRY: No anxiety or depression.   ROS  DRUG ALLERGIES:  No Known Allergies  VITALS:  Blood pressure (!) 116/54, pulse 100, temperature 98 F (36.7 C), temperature source Oral, resp. rate 20, height 5\' 8"  (1.727 m), weight 96.2 kg (212 lb), SpO2 100 %.  PHYSICAL EXAMINATION:  GENERAL:  83 y.o.-year-old patient lying in the bed with no acute distress.  EYES: Pupils equal, round, reactive to light and accommodation. No scleral icterus. Extraocular muscles intact.  HEENT: Head atraumatic, normocephalic. Oropharynx and nasopharynx clear.  NECK:  Supple, no jugular venous distention. No thyroid enlargement, no tenderness.  LUNGS: Normal breath sounds bilaterally, no wheezing, rales,rhonchi or crepitation. No use of accessory muscles of respiration.   CARDIOVASCULAR: S1, S2 normal. No murmurs, rubs, or gallops.  ABDOMEN: Soft, nontender, nondistended. Bowel sounds present. No organomegaly or mass.  EXTREMITIES: No pedal edema, cyanosis, or clubbing.  NEUROLOGIC: Cranial nerves II through XII are intact. Muscle strength 4/5 in all extremities. Sensation intact. Gait not checked.  PSYCHIATRIC: The patient is alert and oriented x 3.  SKIN: No obvious rash, lesion, or ulcer.   Physical Exam LABORATORY PANEL:   CBC Recent Labs  Lab 06/28/17 0626 06/29/17 0750  WBC 16.6*  --   HGB 6.5* 4.8*  HCT 20.5* 15.1*  PLT 282  --    ------------------------------------------------------------------------------------------------------------------  Chemistries  Recent Labs  Lab 06/27/17 1332 06/28/17 0626  NA 140 143  K 4.4 4.1  CL 109 114*  CO2 24 22  GLUCOSE 131* 103*  BUN 93* 95*  CREATININE 1.95* 1.81*  CALCIUM 8.9 8.6*  AST 17  --   ALT 12*  --   ALKPHOS 61  --   BILITOT 0.5  --    ------------------------------------------------------------------------------------------------------------------  Cardiac Enzymes Recent Labs  Lab 06/27/17 1332  TROPONINI 0.04*   ------------------------------------------------------------------------------------------------------------------  RADIOLOGY:  Ct Head Wo Contrast  Result Date: 06/27/2017 CLINICAL DATA:  Patient fell last evening with multiple falls recently. Confusion and neck pain following fall. EXAM: CT HEAD WITHOUT CONTRAST CT CERVICAL SPINE WITHOUT CONTRAST TECHNIQUE: Multidetector CT imaging of the head and cervical spine was performed following the standard protocol without intravenous contrast. Multiplanar CT image reconstructions of the cervical spine were also generated. COMPARISON:  11/04/2011 MRI of the cervical spine FINDINGS: CT HEAD FINDINGS BRAIN: There is mild sulcal and mild to moderate ventricular prominence consistent with  superficial and central atrophy. No  intraparenchymal hemorrhage, mass effect nor midline shift. Periventricular and subcortical white matter hypodensities consistent with chronic small vessel ischemic disease are identified. Idiopathic basal ganglial calcifications are noted bilaterally. No acute large vascular territory infarcts. No abnormal extra-axial fluid collections. Basal cisterns are not effaced and midline. VASCULAR: Moderate calcific atherosclerosis of the carotid siphons. SKULL: No skull fracture. No significant scalp soft tissue swelling. SINUSES/ORBITS: The mastoid air-cells are clear. The included paranasal sinuses are well-aerated.The included ocular globes and orbital contents are non-suspicious. OTHER: None. CT CERVICAL SPINE FINDINGS Alignment: Intact atlantodental and craniocervical relationships. Normal cervical lordosis. Skull base and vertebrae: Incomplete posterior arch of C1, developmental in appearance. Subcortical cystic change of the anterior odontoid body. No acute cervical spine fracture. Intact skullbase. Soft tissues and spinal canal: No prevertebral fluid or swelling. No visible canal hematoma. Disc levels: C2-C3: No significant central or neural foraminal encroachment. No focal disc herniation. C3-C4: Partially calcified mild disc bulge. No significant neural foraminal or central canal stenosis. C4-C5: Mild partially calcified disc bulge without focal disc herniation, canal stenosis or neural foraminal encroachment. Mild left C4-5 uncinate process hypertrophy. C5-C6: No focal disc herniation, canal stenosis or significant neural foraminal encroachment. Small anterior osteophytes. Mild left-sided uncinate process hypertrophy. C6-C7: Small anterior osteophytes. No focal disc herniation, canal stenosis or significant neural foraminal encroachment. C7-T1:  Negative Upper chest: Left apical scarring. Other: No jumped or perched facets. IMPRESSION: 1. No acute intracranial nor cervical spine abnormality. 2. Chronic small  vessel ischemic disease periventricular and subcortical white matter. 3. Mild cervical spondylosis. Electronically Signed   By: Tollie Ethavid  Kwon M.D.   On: 06/27/2017 13:25   Ct Cervical Spine Wo Contrast  Result Date: 06/27/2017 CLINICAL DATA:  Patient fell last evening with multiple falls recently. Confusion and neck pain following fall. EXAM: CT HEAD WITHOUT CONTRAST CT CERVICAL SPINE WITHOUT CONTRAST TECHNIQUE: Multidetector CT imaging of the head and cervical spine was performed following the standard protocol without intravenous contrast. Multiplanar CT image reconstructions of the cervical spine were also generated. COMPARISON:  11/04/2011 MRI of the cervical spine FINDINGS: CT HEAD FINDINGS BRAIN: There is mild sulcal and mild to moderate ventricular prominence consistent with superficial and central atrophy. No intraparenchymal hemorrhage, mass effect nor midline shift. Periventricular and subcortical white matter hypodensities consistent with chronic small vessel ischemic disease are identified. Idiopathic basal ganglial calcifications are noted bilaterally. No acute large vascular territory infarcts. No abnormal extra-axial fluid collections. Basal cisterns are not effaced and midline. VASCULAR: Moderate calcific atherosclerosis of the carotid siphons. SKULL: No skull fracture. No significant scalp soft tissue swelling. SINUSES/ORBITS: The mastoid air-cells are clear. The included paranasal sinuses are well-aerated.The included ocular globes and orbital contents are non-suspicious. OTHER: None. CT CERVICAL SPINE FINDINGS Alignment: Intact atlantodental and craniocervical relationships. Normal cervical lordosis. Skull base and vertebrae: Incomplete posterior arch of C1, developmental in appearance. Subcortical cystic change of the anterior odontoid body. No acute cervical spine fracture. Intact skullbase. Soft tissues and spinal canal: No prevertebral fluid or swelling. No visible canal hematoma. Disc  levels: C2-C3: No significant central or neural foraminal encroachment. No focal disc herniation. C3-C4: Partially calcified mild disc bulge. No significant neural foraminal or central canal stenosis. C4-C5: Mild partially calcified disc bulge without focal disc herniation, canal stenosis or neural foraminal encroachment. Mild left C4-5 uncinate process hypertrophy. C5-C6: No focal disc herniation, canal stenosis or significant neural foraminal encroachment. Small anterior osteophytes. Mild left-sided uncinate process hypertrophy. C6-C7: Small anterior osteophytes. No  focal disc herniation, canal stenosis or significant neural foraminal encroachment. C7-T1:  Negative Upper chest: Left apical scarring. Other: No jumped or perched facets. IMPRESSION: 1. No acute intracranial nor cervical spine abnormality. 2. Chronic small vessel ischemic disease periventricular and subcortical white matter. 3. Mild cervical spondylosis. Electronically Signed   By: Tollie Eth M.D.   On: 06/27/2017 13:25   Mr Hip Right Wo Contrast  Result Date: 06/28/2017 CLINICAL DATA:  Acute right hip pain EXAM: MR OF THE RIGHT HIP WITHOUT CONTRAST TECHNIQUE: Multiplanar, multisequence MR imaging was performed. No intravenous contrast was administered. COMPARISON:  Radiographs from 06/27/2017 FINDINGS: Bones: No abnormal marrow edema or other findings to suggest acute fracture involving the right hip or regional pelvis. Spur like projection medially along the proximal metaphysis of the right proximal femur appears chronic and may represent a spur related to the hip adductor musculature or conceivably a chronic osteochondroma. There is evidence of lumbar spondylosis and degenerative disc disease. Articular cartilage and labrum Articular cartilage: Preserved thickness, no appreciable focal defect. Labrum:  Grossly intact Joint or bursal effusion Joint effusion:  Absent Bursae:  No regional bursitis. Muscles and tendons Muscles and tendons: Mild  abnormal edema laterally in the gluteus maximus, images 16-24 of series 7, probably reflecting a muscle strain. Mild proximal tendinopathy in the right hamstring tendons. Subtle edema medially in both the right and left hip adductor musculature for example on image 15/7 which may reflect muscle strain. Other findings Miscellaneous: The prostate gland measures 7.4 by 8.8 by 8.8 cm (volume = 300 cm^3) and demonstrates internal heterogeneity in the central zone characteristic of benign prostatic hypertrophy, although a low T2 signal 2.5 by 2.1 by 2.6 cm (volume = 7.1 cm^3) focus in the right transitional zone is nonspecific with respect to malignancy. On image 19/4 there appear to be cystic lesions from the lower poles of the kidneys, similar findings were shown on the CT abdomen from 10/29/2011 and accordingly I doubt that this requires further workup. IMPRESSION: 1. No fracture or acute bony finding. 2. Strain of the lateral portion of the right gluteus maximus, potentially acute. There is also subtle edema medially in the adductor musculature both hips which could reflect a mild sprain. 3. Markedly enlarged prostate gland with volume estimated at 300 cc. No specific findings of malignancy although there is a low T2 signal nodule in the right transition zone which is nonspecific. 4. Osteochondroma or spur along the attachment site of some of the adductor musculature to the right femur, this is felt to be chronic. 5. Lumbar spondylosis and degenerative disc disease. These results will be called to the ordering clinician or representative by the Radiologist Assistant, and communication documented in the PACS or zVision Dashboard. Electronically Signed   By: Gaylyn Rong M.D.   On: 06/28/2017 08:03   Dg Hip Unilat W Or Wo Pelvis 2-3 Views Right  Result Date: 06/27/2017 CLINICAL DATA:  Right hip pain after fall yesterday. EXAM: DG HIP (WITH OR WITHOUT PELVIS) 2-3V RIGHT COMPARISON:  CT abdomen and pelvis dated  October 29, 2011. FINDINGS: No acute fracture or malalignment. The bilateral hip joint spaces are relatively preserved. Probable small osteochondroma arising from the right medial femoral neck, unchanged. The sacroiliac joints and pubic symphysis are intact. Degenerative changes of the lower lumbar spine. IMPRESSION: No acute fracture or malalignment. If occult hip fracture is suspected or if the patient is unable to bear weight, MRI is the preferred modality for further evaluation. Electronically Signed  By: Obie Dredge M.D.   On: 06/27/2017 14:20    ASSESSMENT AND PLAN:   Active Problems:   Hip pain   UGIB (upper gastrointestinal bleed)  * Acute anemia from GI bleed secondary to melena and noticed hematemesis today Stool for occult blood is positive Hemoglobin is at 4.8 today we will transfuse 2 units of blood asap, discussed with the gastroenterology will consider doing endoscopy after blood transfusion if hemoglobin is stable Nothing by mouth, continue Protonix drip Monitor hemoglobin and hematocrit Discussed with gastroenterology will transfer the patient to intensive care call placed to intensivist  *Right hip pain -X-ray is negative any acute pathology -Physical therapy evaluation pending ,may need placement - MRI - no fracture but have muscle sprain, may need PT and Muscle relaxors.   * Acute on chronic anemia from melena Will go ahead and transfuse 2 units of blood and monitor hemoglobin and hematocrit closely   discussed with pt about possible side effects of blood transfusion- including more common like low grade fever to rare and serious like renal and lung involvement and infections. Patient is aware of the adverse effects and agreeable with the transfusion    *Acute renal failure -IV hydration  monitor creatinine 1.81 today  *BPH -Continue tamsulosin  *Headache Tylenol as needed    All the records are reviewed and case discussed with Care Management/Social  Workerr. Management plans discussed with the patient, he is  in agreement.  CODE STATUS: Full. Brother zaheer wageman is the healthcare power of attorney, voicemail left to Brother to call back Contact phone #[phone]  TOTAL CRITICAL CARE TIME TAKING CARE OF THIS PATIENT: 35 minutes.     POSSIBLE D/C IN 1-2 DAYS, DEPENDING ON CLINICAL CONDITION.   Ramonita Lab M.D on 06/29/2017   Between 7am to 6pm - Pager - [phone]  After 6pm go to [email or web address] - Social research officer, government  [facility] [place] [facility]  Office  [phone]  CC: Primary care physician; Shamleffer, Landry Mellow, MD  Note: This dictation was prepared with Dragon dictation along with smaller phrase technology. Any transcriptional errors that result from this process are unintentional.

## 2017-06-29 NOTE — Progress Notes (Signed)
Writer accompanied patient to Endo Suite at [employer].  [facility] to [facility].  VSS.

## 2017-06-29 NOTE — Progress Notes (Signed)
Code Blue called to Endo suite approx 1800.  Writer and several other staff arrived to find endo staff around bed.  Per Raynelle FanningJulie pt seemed ok, but had experienced bradycardia and some agonal breaths.  Procedure was over, writer could not see the patient.  Writer and all other staff other than endo left suite.  15 minute call received in ICU shortly after writer arrived back to ICU.   When patient was arrived back to unit approx 1835 he was intubated and being ventilated with an ambu bag.  CCRN and Raynelle FanningJulie stated the patient coded and they thought we were all aware?  They apparently did end up coding the patient after everyone left and did 1 minute of CPR and gave atropine and epi x 1.  RT called and Dr Sung AmabileSimonds called.  We stopped giving bag breaths and patient was breathing on his own around the tube.  Dr Sung AmabileSimonds was on the phone and ordered patient extubated.  HE was awake by this time and responsive. We waited 5 minutes per Anesthesiology and then suctioned him and extubated him to 4 liters.  VSS and blood pressure normalizing.  Ordered a 12 lead EKG per Dr Sung AmabileSimonds.  [facility] aware

## 2017-06-29 NOTE — ED Provider Notes (Signed)
-----------------------------------------   6:28 PM on 06/29/2017 -----------------------------------------  I was called to bedside in the endoscopy suite for a CODE BLUE as the a side doctor.  Upon arrival, there was anesthesia at bedside, patient had been stabilized and they did not feel any further intervention was warranted for me.  We therefore stood down at that time.   Jeanmarie PlantMcShane, James A, MD 06/29/17 [phone]

## 2017-06-30 ENCOUNTER — Encounter: Payer: Self-pay | Admitting: Internal Medicine

## 2017-06-30 ENCOUNTER — Inpatient Hospital Stay: Payer: Medicare Other

## 2017-06-30 LAB — COMPREHENSIVE METABOLIC PANEL
ALK PHOS: 49 U/L (ref 38–126)
ALT: 17 U/L (ref 17–63)
AST: 18 U/L (ref 15–41)
Albumin: 2.5 g/dL — ABNORMAL LOW (ref 3.5–5.0)
Anion gap: 6 (ref 5–15)
BUN: 59 mg/dL — AB (ref 6–20)
CALCIUM: 8.1 mg/dL — AB (ref 8.9–10.3)
CO2: 20 mmol/L — AB (ref 22–32)
CREATININE: 1.78 mg/dL — AB (ref 0.61–1.24)
Chloride: 117 mmol/L — ABNORMAL HIGH (ref 101–111)
GFR calc non Af Amer: 34 mL/min — ABNORMAL LOW (ref >60)
GFR, EST AFRICAN AMERICAN: 39 mL/min — AB (ref >60)
Glucose, Bld: 146 mg/dL — ABNORMAL HIGH (ref 65–99)
Potassium: 4 mmol/L (ref 3.5–5.1)
SODIUM: 143 mmol/L (ref 135–145)
Total Bilirubin: 0.8 mg/dL (ref 0.3–1.2)
Total Protein: 5.7 g/dL — ABNORMAL LOW (ref 6.5–8.1)

## 2017-06-30 LAB — GLUCOSE, CAPILLARY: Glucose-Capillary: 145 mg/dL — ABNORMAL HIGH (ref 65–99)

## 2017-06-30 LAB — CBC
HCT: 20.8 % — ABNORMAL LOW (ref 40.0–52.0)
HEMOGLOBIN: 6.7 g/dL — AB (ref 13.0–18.0)
MCH: 27.3 pg (ref 26.0–34.0)
MCHC: 32.2 g/dL (ref 32.0–36.0)
MCV: 84.8 fL (ref 80.0–100.0)
Platelets: 239 10*3/uL (ref 150–440)
RBC: 2.45 MIL/uL — AB (ref 4.40–5.90)
RDW: 14.5 % (ref 11.5–14.5)
WBC: 22.1 10*3/uL — ABNORMAL HIGH (ref 3.8–10.6)

## 2017-06-30 LAB — APTT: aPTT: 31 seconds (ref 24–36)

## 2017-06-30 LAB — HEMOGLOBIN AND HEMATOCRIT, BLOOD
HCT: 27.6 % — ABNORMAL LOW (ref 40.0–52.0)
HEMOGLOBIN: 9.2 g/dL — AB (ref 13.0–18.0)

## 2017-06-30 LAB — PROTIME-INR
INR: 1.12
PROTHROMBIN TIME: 14.3 s (ref 11.4–15.2)

## 2017-06-30 LAB — PREPARE RBC (CROSSMATCH)

## 2017-06-30 LAB — AMYLASE: AMYLASE: 52 U/L (ref 28–100)

## 2017-06-30 LAB — LIPASE, BLOOD: LIPASE: 17 U/L (ref 11–51)

## 2017-06-30 MED ORDER — MORPHINE SULFATE (PF) 2 MG/ML IV SOLN
2.0000 mg | INTRAVENOUS | Status: DC | PRN
  Administered 2017-06-30: 2 mg via INTRAVENOUS
  Filled 2017-06-30: qty 1

## 2017-06-30 MED ORDER — FENTANYL CITRATE (PF) 100 MCG/2ML IJ SOLN
25.0000 ug | INTRAMUSCULAR | Status: DC | PRN
  Administered 2017-06-30 (×2): 50 ug via INTRAVENOUS
  Filled 2017-06-30 (×2): qty 2

## 2017-06-30 MED ORDER — SODIUM CHLORIDE 0.9 % IV SOLN
Freq: Once | INTRAVENOUS | Status: AC
  Administered 2017-06-30: 04:00:00 via INTRAVENOUS

## 2017-06-30 MED ORDER — PANTOPRAZOLE SODIUM 40 MG IV SOLR
40.0000 mg | Freq: Two times a day (BID) | INTRAVENOUS | Status: DC
  Administered 2017-06-30 – 2017-07-04 (×9): 40 mg via INTRAVENOUS
  Filled 2017-06-30 (×9): qty 40

## 2017-06-30 MED ORDER — SODIUM CHLORIDE 0.9 % IV SOLN: Freq: Once | INTRAVENOUS | Status: DC

## 2017-06-30 MED ORDER — HYDRALAZINE HCL 20 MG/ML IJ SOLN
10.0000 mg | INTRAMUSCULAR | Status: DC | PRN
  Administered 2017-06-30: 10 mg via INTRAVENOUS
  Filled 2017-06-30: qty 1

## 2017-06-30 MED ORDER — MORPHINE SULFATE (PF) 2 MG/ML IV SOLN: 2.0000 mg | INTRAVENOUS | Status: DC | PRN

## 2017-06-30 NOTE — Progress Notes (Signed)
[employer] [facility] - [place] at [facility]   PATIENT NAME: Austin Dyer    MR#:  [id number]  DATE OF BIRTH:  12/11/1933  SUBJECTIVE:  CHIEF COMPLAINT:   Chief Complaint  Patient presents with  . Fall    Came after a fall and weakness, found to have GI bleed. RN is reporting black stool, hemoglobin is at 4.8 today after receiving blood transfusion 1 unit yesterday  His MRI on hip is negative for fracture.  06/29/17 patient was having hematemesis and melena and transferred to intensive care unit.  Had EGD done which has revealed bleeding gastric ulcer  06/30/2017; patient is resting comfortably but reporting epigastric abdominal pain.  According to the nurses report patient is still vomiting blood and having melena  REVIEW OF SYSTEMS:  CONSTITUTIONAL: No fever, positive for fatigue or weakness. Reporting headache EYES: No blurred or double vision.  EARS, NOSE, AND THROAT: No tinnitus or ear pain.  RESPIRATORY: No cough, shortness of breath, wheezing or hemoptysis.  CARDIOVASCULAR: No chest pain, orthopnea, edema.  GASTROINTESTINAL: Patient has melena and hematemesis reporting epigastric abdominal pain GENITOURINARY: No dysuria, hematuria.  ENDOCRINE: No polyuria, nocturia,  HEMATOLOGY: No anemia, easy bruising or bleeding SKIN: No rash or lesion. MUSCULOSKELETAL: No joint pain or arthritis.   NEUROLOGIC: No tingling, numbness, weakness.  PSYCHIATRY: No anxiety or depression.   ROS  DRUG ALLERGIES:  No Known Allergies  VITALS:  Blood pressure (!) 155/82, pulse 91, temperature 98.2 F (36.8 C), temperature source Oral, resp. rate 16, height 5\' 8"  (1.727 m), weight 97.1 kg (214 lb 1.1 oz), SpO2 100 %.  PHYSICAL EXAMINATION:  GENERAL:  83 y.o.-year-old patient lying in the bed with no acute distress.  EYES: Pupils equal, round, reactive to light and accommodation. No scleral icterus. Extraocular muscles intact.  HEENT: Head atraumatic, normocephalic. Oropharynx and  nasopharynx clear.  NECK:  Supple, no jugular venous distention. No thyroid enlargement, no tenderness.  LUNGS: Normal breath sounds bilaterally, no wheezing, rales,rhonchi or crepitation. No use of accessory muscles of respiration.  CARDIOVASCULAR: S1, S2 normal. No murmurs, rubs, or gallops.  ABDOMEN: Soft, epigastric area is tender, no rebound tenderness, nondistended. Bowel sounds present. No organomegaly or mass.  EXTREMITIES: No pedal edema, cyanosis, or clubbing.  NEUROLOGIC: Cranial nerves II through XII are intact. Muscle strength 4/5 in all extremities. Sensation intact. Gait not checked.  PSYCHIATRIC: The patient is alert and oriented x 3.  SKIN: No obvious rash, lesion, or ulcer.   Physical Exam LABORATORY PANEL:   CBC Recent Labs  Lab 06/30/17 0206  WBC 22.1*  HGB 6.7*  HCT 20.8*  PLT 239   ------------------------------------------------------------------------------------------------------------------  Chemistries  Recent Labs  Lab 06/30/17 0206  NA 143  K 4.0  CL 117*  CO2 20*  GLUCOSE 146*  BUN 59*  CREATININE 1.78*  CALCIUM 8.1*  AST 18  ALT 17  ALKPHOS 49  BILITOT 0.8   ------------------------------------------------------------------------------------------------------------------  Cardiac Enzymes Recent Labs  Lab 06/27/17 1332  TROPONINI 0.04*   ------------------------------------------------------------------------------------------------------------------  RADIOLOGY:  Dg Abd 1 View  Result Date: 06/30/2017 CLINICAL DATA:  Abdominal pain and vomiting. EXAM: ABDOMEN - 1 VIEW COMPARISON:  None. FINDINGS: The bowel gas pattern is normal. No radio-opaque calculi or other significant radiographic abnormality are seen. RIGHT femoral neck exostosis. LEFT lung base airspace opacity. IMPRESSION: Normal bowel gas pattern. LEFT lung base atelectasis or pneumonia. Recommend chest radiograph. Electronically Signed   By: Awilda Metroourtnay  Bloomer M.D.   On:  06/30/2017 03:40  ASSESSMENT AND PLAN:   Active Problems:   Hip pain   UGIB (upper gastrointestinal bleed)  * Acute anemia from GI bleed secondary to melena and noticed hematemesis today Stool for occult blood is positive Hemoglobin is at 4.8 06/29/2017, status post 3 units of blood transfusion currently hemoglobin is at 6.7 we will transfuse blood asap, discussed with the intensivist  Protonix drip Monitor hemoglobin and hematocrit   *Epigastric abdominal pain secondary to gastric ulcer Patient had EGD done on 06/29/2017 which has revealed bleeding gastric ulcer which was injected with epinephrine Follow-up with gastroenterology Pain management as needed Protonix  *Possible developing pneumonia Abdominal x-ray has revealed a developing pneumonia versus atelectasis.  Patient needs chest x-ray  *Right hip pain -X-ray is negative any acute pathology -Physical therapy evaluation pending ,may need placement - MRI - no fracture but have muscle sprain, may need PT and Muscle relaxors.    *Acute renal failure -IV hydration  monitor creatinine 1.81--1.78 today  *BPH -Continue tamsulosin  *Headache Tylenol as needed    All the records are reviewed and case discussed with Care Management/Social Workerr. Management plans discussed with the patient, he is  in agreement.  Discussed with intensivist Dr. Belia Heman  CODE STATUS: Full. Brother quincey quesinberry is the healthcare power of attorneyContact phone #[phone]  TOTAL TIME TAKING CARE OF THIS PATIENT: 35 minutes.     POSSIBLE D/C IN 1-2 DAYS, DEPENDING ON CLINICAL CONDITION.   Ramonita Lab M.D on 06/30/2017   Between 7am to 6pm - Pager - [phone]  After 6pm go to [email or web address] - Social research officer, government  [facility] [place] [facility]  Office  [phone]  CC: Primary care physician; Shamleffer, Landry Mellow, MD  Note: This dictation was prepared with Dragon dictation along with smaller phrase technology. Any  transcriptional errors that result from this process are unintentional.

## 2017-06-30 NOTE — Progress Notes (Signed)
Pt b/p continues to raise 206/106. I contacted Elink Dr.Sommer and he ordered some PRN's meds for b/p and pain, will give and continue to monitor.  B/P now 155/82

## 2017-06-30 NOTE — Progress Notes (Signed)
eLink Physician-Brief Progress Note Patient Name: Austin Dyer DOB: 01/28/1934 MRN: [id number]   Date of Service  06/30/2017  HPI/Events of Note  Episode of vomiting dark blood. Patient had EGB done today and findings were a oozing GU which was injected and cauterized.  BP = 176/85 and HR = 108. Last Hgb = 6.9 at about 3 PM.  eICU Interventions  Will order:  1. Send CBC, PT/INR, PTT and CMP ordered for 5 AM now.  2. Notify GI of episode of emesis of dark blood.      Intervention Category Major Interventions: Other:  Ezrah Dembeck Dennard Nipugene 06/30/2017, 2:01 AM

## 2017-06-30 NOTE — Progress Notes (Signed)
[id number] Vomiting old blood and clots. Blood maroon and dark brown in color. Patient bathed and bed changed. Also had black liquid stool when bed changed.Prn for nausea and abdominal pain given per patient request.

## 2017-06-30 NOTE — Consult Note (Signed)
PULMONARY / CRITICAL CARE MEDICINE   Name: Nicky Pughmo R Clausing MRN: [id number] DOB: 09/06/1933    ADMISSION DATE:  06/27/2017 CONSULTATION DATE:  11/04/`8  PT PROFILE:   [age] M who lives independently admitted 11/02 via ED after fall with noted melena and anemia. EGD planned. Transferred to SDU AM 11/04 for Hgb 4.5  MAJOR EVENTS/TEST RESULTS: 11/02 Admission as above 11/03 GI Consult: Continue IV protonix. Serial H/H. Serial examinations. EGD in AM 11/04 Transferred to SDU. 2 units PRBCs ordered 11/5 will order 1 unit PRBC   MICRO DATA: MRSA PCR 11/04 >>   ANTIMICROBIALS:  None   HPI Low HGB GI following Will need blood transfusions +anxioua    REVIEW OF SYSTEMS:   Denies pain, dyspnea +nausea All other ROS negtaive  VITAL SIGNS: BP (!) 155/82   Pulse 91   Temp 98.2 F (36.8 C) (Oral)   Resp 16   Ht 5\' 8"  (1.727 m)   Wt 214 lb 1.1 oz (97.1 kg)   SpO2 100%   BMI 32.55 kg/m   INTAKE / OUTPUT: I/O last 3 completed shifts: In: 5524.3 [I.V.:4186.5; Blood:1187.8; Other:150] Out: 2620 [Urine:2500; Emesis/NG output:120]  PHYSICAL EXAMINATION:  Gen: WDWN in NAD HEENT: NCAT, sclerae white, oropharynx normal Neck: No LAN, no JVD noted Lungs: full BS, normal percussion note, no adventitious sounds Cardiovascular: Reg, no M noted Abdomen: Soft, NT, +BS Ext: no C/C/E Neuro: PERRL, EOMI, motor/sensory grossly intact Skin: No lesions noted   LABS:  BMET Recent Labs  Lab 06/27/17 1332 06/28/17 0626 06/30/17 0206  NA 140 143 143  K 4.4 4.1 4.0  CL 109 114* 117*  CO2 24 22 20*  BUN 93* 95* 59*  CREATININE 1.95* 1.81* 1.78*  GLUCOSE 131* 103* 146*    Electrolytes Recent Labs  Lab 06/27/17 1332 06/28/17 0626 06/30/17 0206  CALCIUM 8.9 8.6* 8.1*    CBC Recent Labs  Lab 06/27/17 1332 06/28/17 0626 06/29/17 0750 06/29/17 1449 06/30/17 0206  WBC 15.6* 16.6*  --   --  22.1*  HGB 8.9* 6.5* 4.8* 6.9* 6.7*  HCT 27.1* 20.5* 15.1* 21.6* 20.8*  PLT 307  282  --   --  239    Coag's Recent Labs  Lab 06/30/17 0206  APTT 31  INR 1.12    Sepsis Markers No results for input(s): LATICACIDVEN, PROCALCITON, O2SATVEN in the last 168 hours.  ABG No results for input(s): PHART, PCO2ART, PO2ART in the last 168 hours.  Liver Enzymes Recent Labs  Lab 06/27/17 1332 06/30/17 0206  AST 17 18  ALT 12* 17  ALKPHOS 61 49  BILITOT 0.5 0.8  ALBUMIN 3.0* 2.5*    Cardiac Enzymes Recent Labs  Lab 06/27/17 1332  TROPONINI 0.04*    Glucose Recent Labs  Lab 06/28/17 1141 06/28/17 1727 06/28/17 2110 06/29/17 0738 06/29/17 1010 06/30/17 0741  GLUCAP 109* 105* 105* 128* 136* 145*    CXR: no recent film  ASSESSMENT / PLAN: Acute blood loss anemia Melena Likely UGIB  Agree with transfusion of RBCs as ordered Cont PPI infusion  IVFs adjusted S/p EGD shows gastric ulcer with bleeding vessel-s/p injection Remains SD status  Wallis BambergKurian Santiago Gladavid Lynae Pederson, M.D.  [facility]  Medical Director [facility] Card[facility]

## 2017-06-30 NOTE — Progress Notes (Signed)
eLink Physician-Brief Progress Note Patient Name: Austin Dyer DOB: 04/29/1934 MRN: [id number]   Date of Service  06/30/2017  HPI/Events of Note  Abdominal X-Ray reveals a normal bowel gas pattern.   eICU Interventions  Await Amylase and Lipase.     Intervention Category Intermediate Interventions: Diagnostic test evaluation  Leo Weyandt Dennard Nipugene 06/30/2017, 4:54 AM

## 2017-06-30 NOTE — Progress Notes (Signed)
RN and tech was giving pt bath, when pt start to vomit up dark red bloody clots,  call [facility] Dr. Despina HiddenSummor, who ordered am labs to be done now. Contacted GI Dr. Norma Fredricksonoledo who stated to continue monitoring pt and B/P and to call him if pt has another esposide or happens again.  Pt B/p is 159/99, no change in orientation status.

## 2017-06-30 NOTE — Progress Notes (Signed)
eLink Physician-Brief Progress Note Patient Name: Austin Dyer DOB: 12/29/1933 MRN: [id number]   Date of Service  06/30/2017  HPI/Events of Note  Hypertension p BP = 206/106 and 2. Patient still c/o abdominal pain. Amylase = 52 and Lipase = 17.   eICU Interventions  Will order: 1. Fentanyl 25-100 mcg IV Q 2 hours PRN pain. 2. Hydralazine 10 mg IV Q 4 hours PRN SBP > 170 or DBP > 100.      Intervention Category Major Interventions: Hypertension - evaluation and management  Sommer,Steven Eugene 06/30/2017, 6:14 AM

## 2017-06-30 NOTE — Progress Notes (Signed)
This note also relates to the following rows which could not be included: BP - Cannot attach notes to unvalidated device data MAP (mmHg) - Cannot attach notes to unvalidated device data Pulse Rate - Cannot attach notes to unvalidated device data ECG Heart Rate - Cannot attach notes to unvalidated device data Resp - Cannot attach notes to unvalidated device data SpO2 - Cannot attach notes to unvalidated device data   

## 2017-06-30 NOTE — Progress Notes (Signed)
1430 Patient complains of abdominal pain. Has not voided all day. Bladder palpated. Bladder taunt and painful. Patient attempted to void but stated he could not. Dr.Kasa consulted.

## 2017-06-30 NOTE — Progress Notes (Addendum)
eLink Physician-Brief Progress Note Patient Name: Austin Dyer R Rouillard DOB: 09/30/1933 MRN: [id number]   Date of Service  06/30/2017  HPI/Events of Note  Multiple issues:  1. Patient c/o abdominal pain, 2. Hgb = 6.7 and 3. Platelets = 239K, PT = 1.12 and PTT = 31.   Marland Kitchen.eICU Interventions  Will order: 1. Abdominal film now.  2. Amylase and Lipase now.  3. Transfuse 1 unit PRBC.      Intervention Category Intermediate Interventions: Abdominal pain - evaluation and management  Lunette Tapp Eugene 06/30/2017, 2:50 AM

## 2017-06-30 NOTE — Progress Notes (Signed)
   06/30/17 0600  Vitals  BP (!) 206/106 (contact elink )  MAP (mmHg) 134  Pulse Rate 95  ECG Heart Rate 94  Resp 20  Oxygen Therapy  SpO2 100 %  contact elink,

## 2017-06-30 NOTE — Care Management (Signed)
Nursing staff mentioned concern that patient is from home alone with limited vision and "need for placement" during progression rounds.  RNCM requested PT. MD states "wait due to medical status".

## 2017-06-30 NOTE — Progress Notes (Signed)
Subjective: Patient seen for f/u Gastric ulcer bleed s/p EGD with injection and bicap cautery hemostasis with 10 Fr Gold probe catheter on 06/29/2017. Pt had a cardiac arrest afterward but was revived via CPR and intubation. Now extubated. Claims some epigastric pain, mild. RN says patient vomited "old, dark blood" twice in the past 18 hours.   Objective: Vital signs in last 24 hours: Temp:  [97.7 F (36.5 C)-99.4 F (37.4 C)] 98.5 F (36.9 C) (11/05 1230) Pulse Rate:  [87-107] 96 (11/05 1400) Resp:  [16-25] 19 (11/05 1400) BP: (102-206)/(58-106) 165/77 (11/05 1400) SpO2:  [98 %-100 %] 100 % (11/05 1400) Weight:  [97.1 kg (214 lb 1.1 oz)] 97.1 kg (214 lb 1.1 oz) (11/05 0455) Blood pressure (!) 165/77, pulse 96, temperature 98.5 F (36.9 C), temperature source Oral, resp. rate 19, height 5\' 8"  (1.727 m), weight 97.1 kg (214 lb 1.1 oz), SpO2 100 %.   Intake/Output from previous day: 11/04 0701 - 11/05 0700 In: 5254.3 [I.V.:4186.5; Blood:917.8] Out: 1320 [Urine:1200; Emesis/NG output:120]  Intake/Output this shift: Total I/O In: 1250 [I.V.:950; Blood:300] Out: -    General appearance:  Somnolent, arousable, Follows commands. Resp:  Coarse BS's bilaterally  Cardio:  RRR no gallop GI:  Mildly tender in epigastrium. No rebound. BS+ Extremities:  Trace edema.    Lab Results: Results for orders placed or performed during the hospital encounter of 06/27/17 (from the past 24 hour(s))  Hemoglobin and hematocrit, blood     Status: Abnormal   Collection Time: 06/29/17  2:49 PM  Result Value Ref Range   Hemoglobin 6.9 (L) 13.0 - 18.0 g/dL   HCT 40.921.6 (L) 81.140.0 - 91.452.0 %  CBC     Status: Abnormal   Collection Time: 06/30/17  2:06 AM  Result Value Ref Range   WBC 22.1 (H) 3.8 - 10.6 K/uL   RBC 2.45 (L) 4.40 - 5.90 MIL/uL   Hemoglobin 6.7 (L) 13.0 - 18.0 g/dL   HCT 78.220.8 (L) 95.640.0 - 21.352.0 %   MCV 84.8 80.0 - 100.0 fL   MCH 27.3 26.0 - 34.0 pg   MCHC 32.2 32.0 - 36.0 g/dL   RDW 08.614.5 57.811.5  - 46.914.5 %   Platelets 239 150 - 440 K/uL  Comprehensive metabolic panel     Status: Abnormal   Collection Time: 06/30/17  2:06 AM  Result Value Ref Range   Sodium 143 135 - 145 mmol/L   Potassium 4.0 3.5 - 5.1 mmol/L   Chloride 117 (H) 101 - 111 mmol/L   CO2 20 (L) 22 - 32 mmol/L   Glucose, Bld 146 (H) 65 - 99 mg/dL   BUN 59 (H) 6 - 20 mg/dL   Creatinine, Ser 6.291.78 (H) 0.61 - 1.24 mg/dL   Calcium 8.1 (L) 8.9 - 10.3 mg/dL   Total Protein 5.7 (L) 6.5 - 8.1 g/dL   Albumin 2.5 (L) 3.5 - 5.0 g/dL   AST 18 15 - 41 U/L   ALT 17 17 - 63 U/L   Alkaline Phosphatase 49 38 - 126 U/L   Total Bilirubin 0.8 0.3 - 1.2 mg/dL   GFR calc non Af Amer 34 (L) >60 mL/min   GFR calc Af Amer 39 (L) >60 mL/min   Anion gap 6 5 - 15  APTT     Status: None   Collection Time: 06/30/17  2:06 AM  Result Value Ref Range   aPTT 31 24 - 36 seconds  Protime-INR     Status: None  Collection Time: 06/30/17  2:06 AM  Result Value Ref Range   Prothrombin Time 14.3 11.4 - 15.2 seconds   INR 1.12   Amylase     Status: None   Collection Time: 06/30/17  2:06 AM  Result Value Ref Range   Amylase 52 28 - 100 U/L  Lipase, blood     Status: None   Collection Time: 06/30/17  2:06 AM  Result Value Ref Range   Lipase 17 11 - 51 U/L  Prepare RBC     Status: None   Collection Time: 06/30/17  3:00 AM  Result Value Ref Range   Order Confirmation ORDER PROCESSED BY BLOOD BANK   Glucose, capillary     Status: Abnormal   Collection Time: 06/30/17  7:41 AM  Result Value Ref Range   Glucose-Capillary 145 (H) 65 - 99 mg/dL  Prepare RBC     Status: None   Collection Time: 06/30/17  9:30 AM  Result Value Ref Range   Order Confirmation DUPLICATE      Recent Labs    06/28/17 0626 06/29/17 0750 06/29/17 1449 06/30/17 0206  WBC 16.6*  --   --  22.1*  HGB 6.5* 4.8* 6.9* 6.7*  HCT 20.5* 15.1* 21.6* 20.8*  PLT 282  --   --  239   BMET Recent Labs    06/28/17 0626 06/30/17 0206  NA 143 143  K 4.1 4.0  CL 114* 117*   CO2 22 20*  GLUCOSE 103* 146*  BUN 95* 59*  CREATININE 1.81* 1.78*  CALCIUM 8.6* 8.1*   LFT Recent Labs    06/30/17 0206  PROT 5.7*  ALBUMIN 2.5*  AST 18  ALT 17  ALKPHOS 49  BILITOT 0.8   PT/INR Recent Labs    06/30/17 0206  LABPROT 14.3  INR 1.12   Hepatitis Panel No results for input(s): HEPBSAG, HCVAB, HEPAIGM, HEPBIGM in the last 72 hours. C-Diff No results for input(s): CDIFFTOX in the last 72 hours. No results for input(s): CDIFFPCR in the last 72 hours.   Studies/Results: Dg Abd 1 View  Result Date: 06/30/2017 CLINICAL DATA:  Abdominal pain and vomiting. EXAM: ABDOMEN - 1 VIEW COMPARISON:  None. FINDINGS: The bowel gas pattern is normal. No radio-opaque calculi or other significant radiographic abnormality are seen. RIGHT femoral neck exostosis. LEFT lung base airspace opacity. IMPRESSION: Normal bowel gas pattern. LEFT lung base atelectasis or pneumonia. Recommend chest radiograph. Electronically Signed   By: Awilda Metro M.D.   On: 06/30/2017 03:40    Scheduled Inpatient Medications:   . brimonidine  1 drop Left Eye BID  . brinzolamide  1 drop Left Eye BID  . finasteride  5 mg Oral Daily  . pantoprazole  40 mg Intravenous Q12H  . prednisoLONE acetate  1 drop Left Eye BID    Continuous Inpatient Infusions:   . dextrose 5% lactated ringers 25 mL/hr (06/30/17 1044)    PRN Inpatient Medications:  acetaminophen **OR** acetaminophen, bisacodyl, hydrALAZINE, morphine injection, morphine injection, [DISCONTINUED] ondansetron **OR** ondansetron (ZOFRAN) IV, traZODone  Miscellaneous:   Assessment:  Stool guaiac positive  Upper GI bleed - Secondary to GU s/p endoscopic hemostasis. Stable.  Fall in home, initial encounter  Altered mental status, unspecified altered mental status type   Anemia secondary to gastrointestinal blood loss     Plan:   1. Begin clear liquid sips.  2. Continue serial H/H. Transfuse as needed.  3. Following.  Thank you.   Mckinnley Smithey K. Norma Fredrickson, M.D. 06/30/2017, 2:17  PM

## 2017-06-30 NOTE — Clinical Social Work Note (Signed)
CSW consulted due to "friend" saying that patient's home may not be good for him. CSW inquired if PT could be ordered if appropriate but Dr. Belia HemanKasa is stating that it is not appropriate at this time. Will defer assessment at this time. York SpanielMonica Isao Seltzer MSW,LCSW [phone]

## 2017-07-01 LAB — BPAM RBC
BLOOD PRODUCT EXPIRATION DATE: 201811132359
BLOOD PRODUCT EXPIRATION DATE: 201811142359
BLOOD PRODUCT EXPIRATION DATE: 201811212359
BLOOD PRODUCT EXPIRATION DATE: 201811212359
Blood Product Expiration Date: 201811152359
Blood Product Expiration Date: 201811212359
ISSUE DATE / TIME: 201811031459
ISSUE DATE / TIME: 201811041134
ISSUE DATE / TIME: 201811040911
ISSUE DATE / TIME: 201811050337
ISSUE DATE / TIME: 201811051053
UNIT TYPE AND RH: 600
UNIT TYPE AND RH: 6200
UNIT TYPE AND RH: 9500
Unit Type and Rh: 600
Unit Type and Rh: 6200
Unit Type and Rh: 6200

## 2017-07-01 LAB — TYPE AND SCREEN
ABO/RH(D): A POS
ANTIBODY SCREEN: NEGATIVE
UNIT DIVISION: 0
UNIT DIVISION: 0
UNIT DIVISION: 0
UNIT DIVISION: 0
Unit division: 0
Unit division: 0

## 2017-07-01 LAB — BASIC METABOLIC PANEL
Anion gap: 6 (ref 5–15)
BUN: 60 mg/dL — ABNORMAL HIGH (ref 6–20)
CHLORIDE: 117 mmol/L — AB (ref 101–111)
CO2: 21 mmol/L — AB (ref 22–32)
CREATININE: 2.61 mg/dL — AB (ref 0.61–1.24)
Calcium: 8.1 mg/dL — ABNORMAL LOW (ref 8.9–10.3)
GFR calc non Af Amer: 21 mL/min — ABNORMAL LOW (ref >60)
GFR, EST AFRICAN AMERICAN: 25 mL/min — AB (ref >60)
Glucose, Bld: 119 mg/dL — ABNORMAL HIGH (ref 65–99)
Potassium: 4 mmol/L (ref 3.5–5.1)
Sodium: 144 mmol/L (ref 135–145)

## 2017-07-01 LAB — CBC WITH DIFFERENTIAL/PLATELET
BASOS PCT: 1 %
Basophils Absolute: 0.1 10*3/uL (ref 0–0.1)
Eosinophils Absolute: 0.7 10*3/uL (ref 0–0.7)
Eosinophils Relative: 4 %
HEMATOCRIT: 23.6 % — AB (ref 40.0–52.0)
HEMOGLOBIN: 7.8 g/dL — AB (ref 13.0–18.0)
LYMPHS ABS: 0.6 10*3/uL — AB (ref 1.0–3.6)
Lymphocytes Relative: 4 %
MCH: 28.3 pg (ref 26.0–34.0)
MCHC: 33.3 g/dL (ref 32.0–36.0)
MCV: 85.2 fL (ref 80.0–100.0)
MONOS PCT: 8 %
Monocytes Absolute: 1.3 10*3/uL — ABNORMAL HIGH (ref 0.2–1.0)
NEUTROS ABS: 13.3 10*3/uL — AB (ref 1.4–6.5)
NEUTROS PCT: 83 %
Platelets: 210 10*3/uL (ref 150–440)
RBC: 2.77 MIL/uL — ABNORMAL LOW (ref 4.40–5.90)
RDW: 14.9 % — ABNORMAL HIGH (ref 11.5–14.5)
WBC: 16 10*3/uL — ABNORMAL HIGH (ref 3.8–10.6)

## 2017-07-01 MED ORDER — TAMSULOSIN HCL 0.4 MG PO CAPS
0.4000 mg | ORAL_CAPSULE | Freq: Every day | ORAL | Status: DC
  Administered 2017-07-02 – 2017-07-04 (×3): 0.4 mg via ORAL
  Filled 2017-07-01 (×3): qty 1

## 2017-07-01 NOTE — Progress Notes (Signed)
Good day. No nausea or problems with oral intake. Remains in [place]. without issues

## 2017-07-01 NOTE — Progress Notes (Signed)
[place] received from [place], [place].  In to see pt.  Pt has no complaints at this time, VSS, AAOx3.  Finishing up meal at this time. Pt informed of possible transfer to the floor soon. Pt in bed, bed locked and in low position.  Will continue to monitor.

## 2017-07-01 NOTE — Progress Notes (Signed)
Subjective: Patient seen for f/u Massive Gastric ulcer bleed. Doing well with no further bleeding. Stable.  Would like to eat. Denies abdominal pain.  Objective: Vital signs in last 24 hours: Temp:  [98.1 F (36.7 C)-98.8 F (37.1 C)] 98.1 F (36.7 C) (11/06 0800) Pulse Rate:  [96-109] 109 (11/06 1100) Resp:  [14-21] 17 (11/06 1100) BP: (118-168)/(63-85) 129/79 (11/06 1100) SpO2:  [96 %-100 %] 96 % (11/06 1100) Weight:  [101.1 kg (222 lb 14.2 oz)] 101.1 kg (222 lb 14.2 oz) (11/06 0500) Blood pressure 129/79, pulse (!) 109, temperature 98.1 F (36.7 C), resp. rate 17, height 5\' 8"  (1.727 m), weight 101.1 kg (222 lb 14.2 oz), SpO2 96 %.   Intake/Output from previous day: 11/05 0701 - 11/06 0700 In: 2697.1 [P.O.:175; I.V.:1622.1; Blood:600] Out: 4850 [Urine:4850]  Intake/Output this shift: Total I/O In: 240 [P.O.:240] Out: 800 [Urine:800]   General appearance:  Alert, oriented. Legally blilnd. Resp:  CTA Cardio:  RR nl S1, S2 GI:  Mildly distended, BS+ Extremities:  Trace edema.   Lab Results: Results for orders placed or performed during the hospital encounter of 06/27/17 (from the past 24 hour(s))  Hemoglobin and hematocrit, blood     Status: Abnormal   Collection Time: 06/30/17  5:08 PM  Result Value Ref Range   Hemoglobin 9.2 (L) 13.0 - 18.0 g/dL   HCT 40.927.6 (L) 81.140.0 - 91.452.0 %  CBC with Differential/Platelet     Status: Abnormal   Collection Time: 07/01/17  6:03 AM  Result Value Ref Range   WBC 16.0 (H) 3.8 - 10.6 K/uL   RBC 2.77 (L) 4.40 - 5.90 MIL/uL   Hemoglobin 7.8 (L) 13.0 - 18.0 g/dL   HCT 78.223.6 (L) 95.640.0 - 21.352.0 %   MCV 85.2 80.0 - 100.0 fL   MCH 28.3 26.0 - 34.0 pg   MCHC 33.3 32.0 - 36.0 g/dL   RDW 08.614.9 (H) 57.811.5 - 46.914.5 %   Platelets 210 150 - 440 K/uL   Neutrophils Relative % 83 %   Neutro Abs 13.3 (H) 1.4 - 6.5 K/uL   Lymphocytes Relative 4 %   Lymphs Abs 0.6 (L) 1.0 - 3.6 K/uL   Monocytes Relative 8 %   Monocytes Absolute 1.3 (H) 0.2 - 1.0 K/uL   Eosinophils Relative 4 %   Eosinophils Absolute 0.7 0 - 0.7 K/uL   Basophils Relative 1 %   Basophils Absolute 0.1 0 - 0.1 K/uL  Basic metabolic panel     Status: Abnormal   Collection Time: 07/01/17  6:03 AM  Result Value Ref Range   Sodium 144 135 - 145 mmol/L   Potassium 4.0 3.5 - 5.1 mmol/L   Chloride 117 (H) 101 - 111 mmol/L   CO2 21 (L) 22 - 32 mmol/L   Glucose, Bld 119 (H) 65 - 99 mg/dL   BUN 60 (H) 6 - 20 mg/dL   Creatinine, Ser 6.292.61 (H) 0.61 - 1.24 mg/dL   Calcium 8.1 (L) 8.9 - 10.3 mg/dL   GFR calc non Af Amer 21 (L) >60 mL/min   GFR calc Af Amer 25 (L) >60 mL/min   Anion gap 6 5 - 15     Recent Labs    06/30/17 0206 06/30/17 1708 07/01/17 0603  WBC 22.1*  --  16.0*  HGB 6.7* 9.2* 7.8*  HCT 20.8* 27.6* 23.6*  PLT 239  --  210   BMET Recent Labs    06/30/17 0206 07/01/17 0603  NA 143 144  K  4.0 4.0  CL 117* 117*  CO2 20* 21*  GLUCOSE 146* 119*  BUN 59* 60*  CREATININE 1.78* 2.61*  CALCIUM 8.1* 8.1*   LFT Recent Labs    06/30/17 0206  PROT 5.7*  ALBUMIN 2.5*  AST 18  ALT 17  ALKPHOS 49  BILITOT 0.8   PT/INR Recent Labs    06/30/17 0206  LABPROT 14.3  INR 1.12   Hepatitis Panel No results for input(s): HEPBSAG, HCVAB, HEPAIGM, HEPBIGM in the last 72 hours. C-Diff No results for input(s): CDIFFTOX in the last 72 hours. No results for input(s): CDIFFPCR in the last 72 hours.   Studies/Results: Dg Abd 1 View  Result Date: 06/30/2017 CLINICAL DATA:  Abdominal pain and vomiting. EXAM: ABDOMEN - 1 VIEW COMPARISON:  None. FINDINGS: The bowel gas pattern is normal. No radio-opaque calculi or other significant radiographic abnormality are seen. RIGHT femoral neck exostosis. LEFT lung base airspace opacity. IMPRESSION: Normal bowel gas pattern. LEFT lung base atelectasis or pneumonia. Recommend chest radiograph. Electronically Signed   By: Awilda Metro M.D.   On: 06/30/2017 03:40    Scheduled Inpatient Medications:   . brimonidine  1 drop  Left Eye BID  . brinzolamide  1 drop Left Eye BID  . finasteride  5 mg Oral Daily  . pantoprazole  40 mg Intravenous Q12H  . prednisoLONE acetate  1 drop Left Eye BID  . [START ON 07/02/2017] tamsulosin  0.4 mg Oral QPC breakfast    Continuous Inpatient Infusions:    PRN Inpatient Medications:  acetaminophen **OR** acetaminophen, bisacodyl, hydrALAZINE, morphine injection, morphine injection, [DISCONTINUED] ondansetron **OR** ondansetron (ZOFRAN) IV, traZODone  Miscellaneous:   Assessment:    Upper GI bleed - Secondary to GU s/p endoscopic hemostasis. Stable.  Fall in home, initial encounter  Altered mental status, unspecified altered mental status type  Anemia secondary to gastrointestinal blood loss   Plan:  Advance diet. May continue PPI bid. Switch to po?  Call me back if I can help.  Check serum H Pylori.  Shamal Stracener K. Norma Fredrickson, M.D. 07/01/2017, 12:04 PM

## 2017-07-01 NOTE — Consult Note (Signed)
PULMONARY / CRITICAL CARE MEDICINE   Name: Austin Dyer MRN: [id number] DOB: 04/16/1934    ADMISSION DATE:  06/27/2017 CONSULTATION DATE:  11/04/`8  PT PROFILE:   [age] M who lives independently admitted 11/02 via ED after fall with noted melena and anemia. EGD planned. Transferred to SDU AM 11/04 for Hgb 4.5  MAJOR EVENTS/TEST RESULTS: 11/02 Admission as above 11/03 GI Consult: Continue IV protonix. Serial H/H. Serial examinations. EGD in AM 11/04 Transferred to SDU. 2 units PRBCs ordered 11/5 will order 1 unit PRBC 11/6 hgb improved   MICRO DATA: MRSA PCR 11/04 >>   ANTIMICROBIALS:  None   HPI HGB improved GI following Transfuse as needed NAD Alert and awake No nausea today    REVIEW OF SYSTEMS:   Denies pain, dyspnea All other ROS negtaive  VITAL SIGNS: BP 135/78 (BP Location: Right Arm)   Pulse (!) 106   Temp 98.1 F (36.7 C) (Oral)   Resp 18   Ht 5\' 8"  (1.727 m)   Wt 222 lb 14.2 oz (101.1 kg)   SpO2 99%   BMI 33.89 kg/m   INTAKE / OUTPUT: I/O last 3 completed shifts: In: 4090.8 [P.O.:175; I.V.:2622.1; Blood:993.8; Other:300] Out: 5370 [Urine:5250; Emesis/NG output:120]  PHYSICAL EXAMINATION:  Gen: WDWN in NAD HEENT: NCAT, sclerae white, oropharynx normal Neck: No LAN, no JVD noted Lungs: full BS, normal percussion note, no adventitious sounds Cardiovascular: Reg, no M noted Abdomen: Soft, NT, +BS Ext: no C/C/E Neuro: PERRL, EOMI, motor/sensory grossly intact Skin: No lesions noted   LABS:  BMET Recent Labs  Lab 06/28/17 0626 06/30/17 0206 07/01/17 0603  NA 143 143 144  K 4.1 4.0 4.0  CL 114* 117* 117*  CO2 22 20* 21*  BUN 95* 59* 60*  CREATININE 1.81* 1.78* 2.61*  GLUCOSE 103* 146* 119*    Electrolytes Recent Labs  Lab 06/28/17 0626 06/30/17 0206 07/01/17 0603  CALCIUM 8.6* 8.1* 8.1*    CBC Recent Labs  Lab 06/28/17 0626  06/30/17 0206 06/30/17 1708 07/01/17 0603  WBC 16.6*  --  22.1*  --  16.0*  HGB 6.5*   < >  6.7* 9.2* 7.8*  HCT 20.5*   < > 20.8* 27.6* 23.6*  PLT 282  --  239  --  210   < > = values in this interval not displayed.    Coag's Recent Labs  Lab 06/30/17 0206  APTT 31  INR 1.12    Sepsis Markers No results for input(s): LATICACIDVEN, PROCALCITON, O2SATVEN in the last 168 hours.  ABG No results for input(s): PHART, PCO2ART, PO2ART in the last 168 hours.  Liver Enzymes Recent Labs  Lab 06/27/17 1332 06/30/17 0206  AST 17 18  ALT 12* 17  ALKPHOS 61 49  BILITOT 0.5 0.8  ALBUMIN 3.0* 2.5*    Cardiac Enzymes Recent Labs  Lab 06/27/17 1332  TROPONINI 0.04*    Glucose Recent Labs  Lab 06/28/17 1141 06/28/17 1727 06/28/17 2110 06/29/17 0738 06/29/17 1010 06/30/17 0741  GLUCAP 109* 105* 105* 128* 136* 145*    CXR: no recent film   ASSESSMENT / PLAN: Acute blood loss anemia S/p EGD bleeding ulcer s/p injection   transfusion of RBCs as needed PPI S/p EGD shows gastric ulcer with bleeding vessel-s/p injection Ok to transfer to gen med floor  Jasminemarie Sherrard Santiago Gladavid Arelene Moroni, M.D.  [facility]  Medical Director [facility] Medical Director [facility]

## 2017-07-01 NOTE — Progress Notes (Signed)
[employer] - [place] at [facility]   PATIENT NAME: Austin Dyer    MR#:  [id number]  DATE OF BIRTH:  09/23/1933  SUBJECTIVE:  CHIEF COMPLAINT:   Chief Complaint  Patient presents with  . Fall    Came after a fall and weakness, found to have GI bleed. RN is reporting black stool, hemoglobin is at 4.8 today after receiving blood transfusion 1 unit yesterday  His MRI on hip is negative for fracture.  06/29/17 patient was having hematemesis and melena and transferred to intensive care unit.  Had EGD done which has revealed bleeding gastric ulcer  07/01/2017; patient is resting comfortably, epigastric abdominal pain resolved, tolerating clear liquids   REVIEW OF SYSTEMS:  CONSTITUTIONAL: No fever, positive for fatigue or weakness. Reporting headache EYES: No blurred or double vision.  EARS, NOSE, AND THROAT: No tinnitus or ear pain.  RESPIRATORY: No cough, shortness of breath, wheezing or hemoptysis.  CARDIOVASCULAR: No chest pain, orthopnea, edema.  GASTROINTESTINAL: Patient has melena and hematemesis reporting epigastric abdominal pain GENITOURINARY: No dysuria, hematuria.  ENDOCRINE: No polyuria, nocturia,  HEMATOLOGY: No anemia, easy bruising or bleeding SKIN: No rash or lesion. MUSCULOSKELETAL: No joint pain or arthritis.   NEUROLOGIC: No tingling, numbness, weakness.  PSYCHIATRY: No anxiety or depression.   ROS  DRUG ALLERGIES:  No Known Allergies  VITALS:  Blood pressure 129/79, pulse (!) 109, temperature 98.1 F (36.7 C), resp. rate 17, height 5\' 8"  (1.727 m), weight 101.1 kg (222 lb 14.2 oz), SpO2 96 %.  PHYSICAL EXAMINATION:  GENERAL:  83 y.o.-year-old patient lying in the bed with no acute distress.  EYES: Pupils equal, round, reactive to light and accommodation. No scleral icterus. Extraocular muscles intact.  HEENT: Head atraumatic, normocephalic. Oropharynx and nasopharynx clear.  NECK:  Supple, no jugular venous distention. No thyroid enlargement, no  tenderness.  LUNGS: Normal breath sounds bilaterally, no wheezing, rales,rhonchi or crepitation. No use of accessory muscles of respiration.  CARDIOVASCULAR: S1, S2 normal. No murmurs, rubs, or gallops.  ABDOMEN: Soft, epigastric area is tender, no rebound tenderness, nondistended. Bowel sounds present. No organomegaly or mass.  EXTREMITIES: No pedal edema, cyanosis, or clubbing.  NEUROLOGIC: Cranial nerves II through XII are intact. Muscle strength 4/5 in all extremities. Sensation intact. Gait not checked.  PSYCHIATRIC: The patient is alert and oriented x 3.  SKIN: No obvious rash, lesion, or ulcer.   Physical Exam LABORATORY PANEL:   CBC Recent Labs  Lab 07/01/17 0603  WBC 16.0*  HGB 7.8*  HCT 23.6*  PLT 210   ------------------------------------------------------------------------------------------------------------------  Chemistries  Recent Labs  Lab 06/30/17 0206 07/01/17 0603  NA 143 144  K 4.0 4.0  CL 117* 117*  CO2 20* 21*  GLUCOSE 146* 119*  BUN 59* 60*  CREATININE 1.78* 2.61*  CALCIUM 8.1* 8.1*  AST 18  --   ALT 17  --   ALKPHOS 49  --   BILITOT 0.8  --    ------------------------------------------------------------------------------------------------------------------  Cardiac Enzymes Recent Labs  Lab 06/27/17 1332  TROPONINI 0.04*   ------------------------------------------------------------------------------------------------------------------  RADIOLOGY:  Dg Abd 1 View  Result Date: 06/30/2017 CLINICAL DATA:  Abdominal pain and vomiting. EXAM: ABDOMEN - 1 VIEW COMPARISON:  None. FINDINGS: The bowel gas pattern is normal. No radio-opaque calculi or other significant radiographic abnormality are seen. RIGHT femoral neck exostosis. LEFT lung base airspace opacity. IMPRESSION: Normal bowel gas pattern. LEFT lung base atelectasis or pneumonia. Recommend chest radiograph. Electronically Signed   By: Michel Santeeourtnay  Bloomer M.D.  On: 06/30/2017 03:40     ASSESSMENT AND PLAN:   Active Problems:   Hip pain   UGIB (upper gastrointestinal bleed)  * Acute anemia from GI bleed secondary to melena and noticed hematemesis  Stool for occult blood is positive Hemoglobin is at 4.8 06/29/2017, status post several  units of blood transfusion   Protonix p.o. twice daily Monitor hemoglobin and hematocrit   *Epigastric abdominal pain secondary to gastric ulcer Patient had EGD done on 06/29/2017 which has revealed bleeding gastric ulcer which was injected with epinephrine Follow-up with gastroenterology prn Pain management as needed Protonix Tolerating clear liquids, advance diet as tolerated Recommending to check serum for H. pylori  *Possible developing pneumonia Abdominal x-ray has revealed a developing pneumonia versus atelectasis.  Intensivist is following  *Right hip pain -X-ray is negative any acute pathology -Physical therapy evaluation pending ,may need placement - MRI - no fracture but have muscle sprain, may need PT and Muscle relaxors.    *Acute renal failure -IV hydration  monitor creatinine 1.81--1.78 today  *BPH -Continue tamsulosin  *Headache Tylenol as needed   to floor as patient is stable  All the records are reviewed and case discussed with Care Management/Social Workerr. Management plans discussed with the patient, he is  in agreement.  Discussed with intensivist Dr. Belia HemanKasa  CODE STATUS: Full. Brother [name] is the healthcare power of attorneyContact phone #[phone]  TOTAL TIME TAKING CARE OF THIS PATIENT: 35 minutes.     POSSIBLE D/C IN 1-2 DAYS, DEPENDING ON CLINICAL CONDITION.   Ramonita LabGouru, Tavonte Seybold M.D on 07/01/2017   Between 7am to 6pm - Pager - [phone]  After 6pm go to [email or web address] - Social research officer, governmentpassword EPAS [facility]  [facility] [place] [facility]  Office  [phone]  CC: Primary care physician; Shamleffer, Landry MellowIbethal Jaralla, MD  Note: This dictation was prepared with Dragon dictation along with  smaller phrase technology. Any transcriptional errors that result from this process are unintentional.

## 2017-07-02 LAB — BASIC METABOLIC PANEL
Anion gap: 5 (ref 5–15)
BUN: 45 mg/dL — AB (ref 6–20)
CALCIUM: 8.2 mg/dL — AB (ref 8.9–10.3)
CO2: 23 mmol/L (ref 22–32)
CREATININE: 2.18 mg/dL — AB (ref 0.61–1.24)
Chloride: 114 mmol/L — ABNORMAL HIGH (ref 101–111)
GFR calc Af Amer: 31 mL/min — ABNORMAL LOW (ref >60)
GFR, EST NON AFRICAN AMERICAN: 27 mL/min — AB (ref >60)
GLUCOSE: 99 mg/dL (ref 65–99)
Potassium: 3.5 mmol/L (ref 3.5–5.1)
SODIUM: 142 mmol/L (ref 135–145)

## 2017-07-02 LAB — HEMOGLOBIN AND HEMATOCRIT, BLOOD
HCT: 27.3 % — ABNORMAL LOW (ref 40.0–52.0)
HEMOGLOBIN: 9.1 g/dL — AB (ref 13.0–18.0)

## 2017-07-02 LAB — CBC
HEMATOCRIT: 22 % — AB (ref 40.0–52.0)
Hemoglobin: 7.3 g/dL — ABNORMAL LOW (ref 13.0–18.0)
MCH: 28.3 pg (ref 26.0–34.0)
MCHC: 33.1 g/dL (ref 32.0–36.0)
MCV: 85.4 fL (ref 80.0–100.0)
PLATELETS: 195 10*3/uL (ref 150–440)
RBC: 2.58 MIL/uL — ABNORMAL LOW (ref 4.40–5.90)
RDW: 15.3 % — AB (ref 11.5–14.5)
WBC: 11.1 10*3/uL — ABNORMAL HIGH (ref 3.8–10.6)

## 2017-07-02 LAB — PREPARE RBC (CROSSMATCH)

## 2017-07-02 LAB — GLUCOSE, CAPILLARY: GLUCOSE-CAPILLARY: 103 mg/dL — AB (ref 65–99)

## 2017-07-02 MED ORDER — SODIUM CHLORIDE 0.9 % IV SOLN
Freq: Once | INTRAVENOUS | Status: AC
  Administered 2017-07-02: 14:00:00 via INTRAVENOUS

## 2017-07-02 MED ORDER — SODIUM CHLORIDE 0.9 % IV SOLN
INTRAVENOUS | Status: DC
  Administered 2017-07-02: 18:00:00 via INTRAVENOUS

## 2017-07-02 MED ORDER — ORAL CARE MOUTH RINSE
15.0000 mL | Freq: Two times a day (BID) | OROMUCOSAL | Status: DC
  Administered 2017-07-02 – 2017-07-04 (×4): 15 mL via OROMUCOSAL

## 2017-07-02 NOTE — Progress Notes (Signed)
[employer] - [place] at [facility]   PATIENT NAME: Austin Dyer    MR#:  [id number]  DATE OF BIRTH:  11/04/1933  SUBJECTIVE:  CHIEF COMPLAINT:   Chief Complaint  Patient presents with  . Fall    Came after a fall and weakness, found to have GI bleed. RN is reporting black stool, hemoglobin is at 4.8 today after receiving blood transfusion 1 unit yesterday  His MRI on hip is negative for fracture.  06/29/17 patient was having hematemesis and melena and transferred to intensive care unit.  Had EGD done which has revealed bleeding gastric ulcer  07/01/2017; patient is resting comfortably, epigastric abdominal pain resolved, tolerating clear liquids   REVIEW OF SYSTEMS:  CONSTITUTIONAL: No fever, positive for fatigue or weakness. Reporting headache EYES: No blurred or double vision.  EARS, NOSE, AND THROAT: No tinnitus or ear pain.  RESPIRATORY: No cough, shortness of breath, wheezing or hemoptysis.  CARDIOVASCULAR: No chest pain, orthopnea, edema.  GASTROINTESTINAL: Patient has melena and hematemesis reporting epigastric abdominal pain GENITOURINARY: No dysuria, hematuria.  ENDOCRINE: No polyuria, nocturia,  HEMATOLOGY: No anemia, easy bruising or bleeding SKIN: No rash or lesion. MUSCULOSKELETAL: No joint pain or arthritis.   NEUROLOGIC: No tingling, numbness, weakness.  PSYCHIATRY: No anxiety or depression.   ROS  DRUG ALLERGIES:  No Known Allergies  VITALS:  Blood pressure 138/67, pulse 98, temperature 98.3 F (36.8 C), temperature source Oral, resp. rate 18, height 5\' 8"  (1.727 m), weight 98.7 kg (217 lb 8 oz), SpO2 96 %.  PHYSICAL EXAMINATION:  GENERAL:  83 y.o.-year-old patient lying in the bed with no acute distress.  EYES: Pupils equal, round, reactive to light and accommodation. No scleral icterus. Extraocular muscles intact.  HEENT: Head atraumatic, normocephalic. Oropharynx and nasopharynx clear.  NECK:  Supple, no jugular venous distention. No thyroid  enlargement, no tenderness.  LUNGS: Normal breath sounds bilaterally, no wheezing, rales,rhonchi or crepitation. No use of accessory muscles of respiration.  CARDIOVASCULAR: S1, S2 normal. No murmurs, rubs, or gallops.  ABDOMEN: Soft, epigastric area is tender, no rebound tenderness, nondistended. Bowel sounds present. No organomegaly or mass.  EXTREMITIES: No pedal edema, cyanosis, or clubbing.  NEUROLOGIC: Cranial nerves II through XII are intact. Muscle strength 4/5 in all extremities. Sensation intact. Gait not checked.  PSYCHIATRIC: The patient is alert and oriented x 3.  SKIN: No obvious rash, lesion, or ulcer.   Physical Exam LABORATORY PANEL:   CBC Recent Labs  Lab 07/02/17 0503  WBC 11.1*  HGB 7.3*  HCT 22.0*  PLT 195   ------------------------------------------------------------------------------------------------------------------  Chemistries  Recent Labs  Lab 06/30/17 0206  07/02/17 0503  NA 143   < > 142  K 4.0   < > 3.5  CL 117*   < > 114*  CO2 20*   < > 23  GLUCOSE 146*   < > 99  BUN 59*   < > 45*  CREATININE 1.78*   < > 2.18*  CALCIUM 8.1*   < > 8.2*  AST 18  --   --   ALT 17  --   --   ALKPHOS 49  --   --   BILITOT 0.8  --   --    < > = values in this interval not displayed.   ------------------------------------------------------------------------------------------------------------------  Cardiac Enzymes Recent Labs  Lab 06/27/17 1332  TROPONINI 0.04*   ------------------------------------------------------------------------------------------------------------------  RADIOLOGY:  No results found.  ASSESSMENT AND PLAN:   Active Problems:   Hip  pain   UGIB (upper gastrointestinal bleed)  * Acute anemia from GI bleed secondary to melena and noticed hematemesis  Stool for occult blood is positive Hemoglobin is at 7.3 today, transfused today, status post several  units of blood transfusion   Protonix p.o. twice daily Monitor hemoglobin  and hematocrit Outpatient follow-up with gastroenterology for repeat EGD in approximately 10 weeks.  GI signed off   *Epigastric abdominal pain secondary to gastric ulcer Patient had EGD done on 06/29/2017 which has revealed bleeding gastric ulcer which was injected with epinephrine Follow-up with gastroenterology prn Pain management as needed Protonix Tolerating clear liquids, advance diet as tolerated Recommending to check serum for H. pylori  *Possible developing pneumonia Abdominal x-ray has revealed a developing pneumonia versus atelectasis.  Intensivist is following  *Right hip pain -X-ray is negative any acute pathology -Physical therapy evaluation pending ,may need placement - MRI - no fracture but have muscle sprain, may need PT and Muscle relaxors.    *Acute renal failure -IV hydration  monitor creatinine 1.81--1.78 --2.18today rept am labs, avoid nephrotoxins, renal ultrasound  *BPH -Continue tamsulosin  *Headache Tylenol as needed  PT consult pending  All the records are reviewed and case discussed with Care Management/Social Workerr. Management plans discussed with the patient, Brother in law  they are  in agreement.    CODE STATUS: Full. Brother Austin Dyer is the healthcare power of attorneyContact phone #[phone].   TOTAL TIME TAKING CARE OF THIS PATIENT: 35 minutes.     POSSIBLE D/C IN 1-2 DAYS, DEPENDING ON CLINICAL CONDITION.   Ramonita LabGouru, Kayn Haymore M.D on 07/02/2017   Between 7am to 6pm - Pager - [phone]  After 6pm go to [email or web address] - Social research officer, governmentpassword EPAS [facility]  [facility] [place] [facility]  Office  [phone]  CC: Primary care physician; Shamleffer, Landry MellowIbethal Jaralla, MD  Note: This dictation was prepared with Dragon dictation along with smaller phrase technology. Any transcriptional errors that result from this process are unintentional.

## 2017-07-02 NOTE — Progress Notes (Signed)
Subjective: Patient seen for f/u gastric ulcer bleed s/p EGD 06/29/17 with endoscopic hemostasis. Doing well with no reported further bleeding. Pt denies pain and is tolerating a regular diet.   Objective: Vital signs in last 24 hours: Temp:  [98.6 F (37 C)-98.9 F (37.2 C)] 98.9 F (37.2 C) (11/07 0519) Pulse Rate:  [71-104] 81 (11/07 0519) Resp:  [16-29] 16 (11/07 0519) BP: (114-186)/(63-102) 130/63 (11/07 0519) SpO2:  [88 %-100 %] 95 % (11/07 0519) Weight:  [98.7 kg (217 lb 8 oz)] 98.7 kg (217 lb 8 oz) (11/07 0500) Blood pressure 130/63, pulse 81, temperature 98.9 F (37.2 C), temperature source Oral, resp. rate 16, height 5\' 8"  (1.727 m), weight 98.7 kg (217 lb 8 oz), SpO2 95 %.   Intake/Output from previous day: 11/06 0701 - 11/07 0700 In: 480 [P.O.:480] Out: 2000 [Urine:2000]  Intake/Output this shift: No intake/output data recorded.   General appearance:  NAD. Legally blind Resp:  CTA Cardio:  RR nl S1, S2 GI:  Soft, nt, nd. No masses. BS+ Extremities:  Trace edema.   Lab Results: Results for orders placed or performed during the hospital encounter of 06/27/17 (from the past 24 hour(s))  Basic metabolic panel     Status: Abnormal   Collection Time: 07/02/17  5:03 AM  Result Value Ref Range   Sodium 142 135 - 145 mmol/L   Potassium 3.5 3.5 - 5.1 mmol/L   Chloride 114 (H) 101 - 111 mmol/L   CO2 23 22 - 32 mmol/L   Glucose, Bld 99 65 - 99 mg/dL   BUN 45 (H) 6 - 20 mg/dL   Creatinine, Ser 1.612.18 (H) 0.61 - 1.24 mg/dL   Calcium 8.2 (L) 8.9 - 10.3 mg/dL   GFR calc non Af Amer 27 (L) >60 mL/min   GFR calc Af Amer 31 (L) >60 mL/min   Anion gap 5 5 - 15  CBC     Status: Abnormal   Collection Time: 07/02/17  5:03 AM  Result Value Ref Range   WBC 11.1 (H) 3.8 - 10.6 K/uL   RBC 2.58 (L) 4.40 - 5.90 MIL/uL   Hemoglobin 7.3 (L) 13.0 - 18.0 g/dL   HCT 09.622.0 (L) 04.540.0 - 40.952.0 %   MCV 85.4 80.0 - 100.0 fL   MCH 28.3 26.0 - 34.0 pg   MCHC 33.1 32.0 - 36.0 g/dL   RDW 81.115.3  (H) 91.411.5 - 14.5 %   Platelets 195 150 - 440 K/uL  Glucose, capillary     Status: Abnormal   Collection Time: 07/02/17  7:48 AM  Result Value Ref Range   Glucose-Capillary 103 (H) 65 - 99 mg/dL     Recent Labs    78/29/5610/01/10 0206 06/30/17 1708 07/01/17 0603 07/02/17 0503  WBC 22.1*  --  16.0* 11.1*  HGB 6.7* 9.2* 7.8* 7.3*  HCT 20.8* 27.6* 23.6* 22.0*  PLT 239  --  210 195   BMET Recent Labs    06/30/17 0206 07/01/17 0603 07/02/17 0503  NA 143 144 142  K 4.0 4.0 3.5  CL 117* 117* 114*  CO2 20* 21* 23  GLUCOSE 146* 119* 99  BUN 59* 60* 45*  CREATININE 1.78* 2.61* 2.18*  CALCIUM 8.1* 8.1* 8.2*   LFT Recent Labs    06/30/17 0206  PROT 5.7*  ALBUMIN 2.5*  AST 18  ALT 17  ALKPHOS 49  BILITOT 0.8   PT/INR Recent Labs    06/30/17 0206  LABPROT 14.3  INR 1.12  Hepatitis Panel No results for input(s): HEPBSAG, HCVAB, HEPAIGM, HEPBIGM in the last 72 hours. C-Diff No results for input(s): CDIFFTOX in the last 72 hours. No results for input(s): CDIFFPCR in the last 72 hours.   Studies/Results: No results found.  Scheduled Inpatient Medications:   . brimonidine  1 drop Left Eye BID  . brinzolamide  1 drop Left Eye BID  . finasteride  5 mg Oral Daily  . pantoprazole  40 mg Intravenous Q12H  . prednisoLONE acetate  1 drop Left Eye BID  . tamsulosin  0.4 mg Oral QPC breakfast    Continuous Inpatient Infusions:    PRN Inpatient Medications:  acetaminophen **OR** acetaminophen, bisacodyl, hydrALAZINE, morphine injection, morphine injection, [DISCONTINUED] ondansetron **OR** ondansetron (ZOFRAN) IV, traZODone  Miscellaneous:   Assessment:  1. Gastric ulcer bleed - stable after endoscopy. On PPI continuing.  2. Legally blind.  3. Anemia secondary to GI blood loss. Hgb 7.3  Plan:  1. Continue PPI therapy. Pantoprazole 40mg  po bid.  2. May require transfusion prior to discharge.  3. Would ideally like to repeat EGD in 8-10 weeks to assure healing.    4. Will sign off. Call back in the interim if I can help. My office number is [phone].  Thank you.  Shaarav Ripple K. Norma Fredricksonoledo, M.D. 07/02/2017, 11:41 AM

## 2017-07-02 NOTE — Plan of Care (Signed)
1xunit of pRBC's transfused. No transfusion reaction occurred. No bleeding noted during the shift. Pt denies n/v. Tylenol given once for headache with improvement. Foley catheter removed at 1756. Poor appetite.

## 2017-07-02 NOTE — Care Management Important Message (Signed)
Important Message  Patient Details  Name: Nicky Pughmo R Yearby MRN: [id number] Date of Birth: 09/07/1933   Medicare Important Message Given:  Yes    Gwenette GreetBrenda S Savaughn Karwowski, RN 07/02/2017, 8:05 AM

## 2017-07-03 ENCOUNTER — Inpatient Hospital Stay: Payer: Medicare Other

## 2017-07-03 LAB — BASIC METABOLIC PANEL
Anion gap: 10 (ref 5–15)
BUN: 33 mg/dL — AB (ref 6–20)
CALCIUM: 8.2 mg/dL — AB (ref 8.9–10.3)
CO2: 22 mmol/L (ref 22–32)
CREATININE: 1.76 mg/dL — AB (ref 0.61–1.24)
Chloride: 110 mmol/L (ref 101–111)
GFR, EST AFRICAN AMERICAN: 40 mL/min — AB (ref >60)
GFR, EST NON AFRICAN AMERICAN: 35 mL/min — AB (ref >60)
Glucose, Bld: 111 mg/dL — ABNORMAL HIGH (ref 65–99)
Potassium: 3.7 mmol/L (ref 3.5–5.1)
SODIUM: 142 mmol/L (ref 135–145)

## 2017-07-03 LAB — BPAM RBC
Blood Product Expiration Date: 201811222359
ISSUE DATE / TIME: 201811071353
Unit Type and Rh: 6200

## 2017-07-03 LAB — TYPE AND SCREEN
ABO/RH(D): A POS
Antibody Screen: NEGATIVE
Unit division: 0

## 2017-07-03 LAB — CBC
HCT: 30.3 % — ABNORMAL LOW (ref 40.0–52.0)
Hemoglobin: 9.9 g/dL — ABNORMAL LOW (ref 13.0–18.0)
MCH: 28.5 pg (ref 26.0–34.0)
MCHC: 32.8 g/dL (ref 32.0–36.0)
MCV: 86.7 fL (ref 80.0–100.0)
PLATELETS: 245 10*3/uL (ref 150–440)
RBC: 3.49 MIL/uL — ABNORMAL LOW (ref 4.40–5.90)
RDW: 15.1 % — AB (ref 11.5–14.5)
WBC: 13 10*3/uL — ABNORMAL HIGH (ref 3.8–10.6)

## 2017-07-03 LAB — GLUCOSE, CAPILLARY: GLUCOSE-CAPILLARY: 107 mg/dL — AB (ref 65–99)

## 2017-07-03 MED ORDER — SUCRALFATE 1 GM/10ML PO SUSP
1.0000 g | Freq: Three times a day (TID) | ORAL | Status: DC
  Administered 2017-07-03 – 2017-07-04 (×4): 1 g via ORAL
  Filled 2017-07-03 (×4): qty 10

## 2017-07-03 NOTE — Progress Notes (Signed)
After post catheter removal, patient was unable to void, bladder scanned,  Bladder had > 450, patient uncomfortable,  in and out cath performed per nursing driven protocol  ( Post Removal Care Guideline), will continue to monitor.

## 2017-07-03 NOTE — Evaluation (Signed)
Physical Therapy Evaluation Patient Details Name: Austin Dyer MRN: [id number] DOB: 07/16/1934 Today's Date: 07/03/2017   History of Present Illness  83 y.o. male with a known history of BPH, STROKE is being admitted for hip pain. Patient states that he fell last night that did not hit his head and did not lose consciousness. Pt with GI bleed and had HGB drop as low as the 4s, has recoverd >9 with transfusions. Pt c/o R hip pain from fall.  Clinical Impression  Pt very pleasant and eager to work with PT but between his seemingly altered mental status (from baseline?), blindness, R hip pain and weakness he did not do as well as he thought he could.  He was still able to ambulate ~70 ft with walker and heavy cuing and some assist but typically he is [facility] and is not close to this baseline level with today's assessment.  Even with walker pt had multiple stagger steps, was very inconsistent with cadence and generally needed a lot of cuing.  Pt wants to go home but understands that his safer and better option is a stint in rehab.     Follow Up Recommendations SNF    Equipment Recommendations  Rolling walker with 5" wheels    Recommendations for Other Services       Precautions / Restrictions Precautions Precautions: Fall Restrictions Weight Bearing Restrictions: No      Mobility  Bed Mobility Overal bed mobility: Needs Assistance Bed Mobility: Supine to Sit     Supine to sit: Min assist;Mod assist     General bed mobility comments: Pt showed good effort in trying to get up to sitting but ultimatley needed assist to lift torso and to shift LEs off EOB and to the floor  Transfers Overall transfer level: Needs assistance Equipment used: Rolling walker (2 wheeled) Transfers: Sit to/from Stand Sit to Stand: Min assist         General transfer comment: Pt attempted to get up w/o assist and was unable, cuing and light assist to get to  standing  Ambulation/Gait Ambulation/Gait assistance: Min assist Ambulation Distance (Feet): 70 Feet Assistive device: Rolling walker (2 wheeled)       General Gait Details: Pt with very inconsistent cadence (shuffling steps with occasional longer strides with heavy cuing).  He generally veered to the L and needed considerable direct directional assist and had numerous stagger steps with unsteadiness but no overt LOBs  Stairs            Wheelchair Mobility    Modified Rankin (Stroke Patients Only)       Balance Overall balance assessment: Needs assistance Sitting-balance support: Bilateral upper extremity supported Sitting balance-Leahy Scale: Good     Standing balance support: Bilateral upper extremity supported Standing balance-Leahy Scale: Fair Standing balance comment: Pt reliant on the walker, did not show good awareness using it during ambulation                             Pertinent Vitals/Pain Pain Assessment: 0-10 Pain Score: 4  Pain Location: R hip    Home Living Family/patient expects to be discharged to:: Private residence Living Arrangements: Alone Available Help at Discharge: Family;Friend(s)   Home Access: Level entry     Home Layout: One level Home Equipment: Cane - single point      Prior Function Level of Independence: Independent with assistive device(s)  Comments: Pt's brother does grocery shopping and other friends also help with errands, driving him, etc - apparently he lives alone w/o issue     Hand Dominance        Extremity/Trunk Assessment   Upper Extremity Assessment Upper Extremity Assessment: Generalized weakness    Lower Extremity Assessment Lower Extremity Assessment: Generalized weakness(mild pain with R LE testing)       Communication   Communication: Expressive difficulties(Pt having some issues completing thoughts/sentences)  Cognition Arousal/Alertness: Awake/alert Behavior During  Therapy: Impulsive;WFL for tasks assessed/performed Overall Cognitive Status: Difficult to assess                                 General Comments: friend is present and reports he does not fully seem like his normal selt      General Comments      Exercises     Assessment/Plan    PT Assessment Patient needs continued PT services  PT Problem List Decreased strength;Decreased range of motion;Decreased activity tolerance;Decreased balance;Decreased mobility;Decreased coordination;Decreased cognition;Decreased knowledge of use of DME;Decreased safety awareness;Pain       PT Treatment Interventions DME instruction;Gait training;Stair training;Functional mobility training;Therapeutic activities;Therapeutic exercise;Balance training;Cognitive remediation;Patient/family education    PT Goals (Current goals can be found in the Care Plan section)  Acute Rehab PT Goals Patient Stated Goal: get strong enough to go home PT Goal Formulation: With patient Time For Goal Achievement: 07/17/17 Potential to Achieve Goals: Fair    Frequency Min 2X/week   Barriers to discharge        Co-evaluation               AM-PAC PT "6 Clicks" Daily Activity  Outcome Measure Difficulty turning over in bed (including adjusting bedclothes, sheets and blankets)?: Unable Difficulty moving from lying on back to sitting on the side of the bed? : Unable Difficulty sitting down on and standing up from a chair with arms (e.g., wheelchair, bedside commode, etc,.)?: Unable Help needed moving to and from a bed to chair (including a wheelchair)?: A Little Help needed walking in hospital room?: A Lot Help needed climbing 3-5 steps with a railing? : A Lot 6 Click Score: 10    End of Session Equipment Utilized During Treatment: Gait belt Activity Tolerance: Patient limited by fatigue;Patient tolerated treatment well Patient left: with chair alarm set;with call bell/phone within reach Nurse  Communication: Mobility status PT Visit Diagnosis: Muscle weakness (generalized) (M62.81);Difficulty in walking, not elsewhere classified (R26.2)    Time: [id number] PT Time Calculation (min) (ACUTE ONLY): 28 min   Charges:   PT Evaluation $PT Eval Low Complexity: 1 Low PT Treatments $Gait Training: 8-22 mins   PT G Codes:   PT G-Codes **NOT FOR INPATIENT CLASS** Functional Assessment Tool Used: AM-PAC 6 Clicks Basic Mobility Functional Limitation: Mobility: Walking and moving around Mobility: Walking and Moving Around Current Status (Y7829(G8978): At least 60 percent but less than 80 percent impaired, limited or restricted Mobility: Walking and Moving Around Goal Status [phone](G8979): At least 20 percent but less than 40 percent impaired, limited or restricted    Malachi ProGalen R Rily Nickey, DPT 07/03/2017, 5:27 PM

## 2017-07-03 NOTE — Progress Notes (Signed)
PT Cancellation Note  Patient Details Name: Austin Dyer MRN: [id number] DOB: 06/13/1934   Cancelled Treatment:    Reason Eval/Treat Not Completed: Patient at procedure or test/unavailable Pt out of room for testing will try back later as time allows.  Malachi ProGalen R Marin Milley, DPT 07/03/2017, 2:04 PM

## 2017-07-03 NOTE — Progress Notes (Signed)
Medications administered by student RN [id number] with supervision of Clinical Instructor Christophere Hillhouse MSN, RN-BC or patient's assigned RN.   

## 2017-07-03 NOTE — Progress Notes (Signed)
[facility] [place] at [facility]   PATIENT NAME: Austin Dyer    MR#:  [id number]  DATE OF BIRTH:  07/09/1934  SUBJECTIVE:  CHIEF COMPLAINT:   Chief Complaint  Patient presents with  . Fall  Patient is feeling better however earlier today had to have a Foley placed due to urinary retention   REVIEW OF SYSTEMS:  CONSTITUTIONAL: No fever, positive for fatigue or weakness. Reporting headache EYES: No blurred or double vision.  EARS, NOSE, AND THROAT: No tinnitus or ear pain.  RESPIRATORY: No cough, shortness of breath, wheezing or hemoptysis.  CARDIOVASCULAR: No chest pain, orthopnea, edema.  GASTROINTESTINAL: Patient has melena and hematemesis reporting epigastric abdominal pain GENITOURINARY: No dysuria, hematuria.  ENDOCRINE: No polyuria, nocturia,  HEMATOLOGY: No anemia, easy bruising or bleeding SKIN: No rash or lesion. MUSCULOSKELETAL: No joint pain or arthritis.   NEUROLOGIC: No tingling, numbness, weakness.  PSYCHIATRY: No anxiety or depression.   ROS  DRUG ALLERGIES:  No Known Allergies  VITALS:  Blood pressure (!) 176/70, pulse 88, temperature (!) 97.4 F (36.3 C), temperature source Oral, resp. rate 18, height 5\' 8"  (1.727 m), weight 217 lb (98.4 kg), SpO2 97 %.  PHYSICAL EXAMINATION:  GENERAL:  83 y.o.-year-old patient lying in the bed with no acute distress.  EYES: Pupils equal, round, reactive to light and accommodation. No scleral icterus. Extraocular muscles intact.  HEENT: Head atraumatic, normocephalic. Oropharynx and nasopharynx clear.  NECK:  Supple, no jugular venous distention. No thyroid enlargement, no tenderness.  LUNGS: Normal breath sounds bilaterally, no wheezing, rales,rhonchi or crepitation. No use of accessory muscles of respiration.  CARDIOVASCULAR: S1, S2 normal. No murmurs, rubs, or gallops.  ABDOMEN: Soft, epigastric area is tender, no rebound tenderness, nondistended. Bowel sounds present. No organomegaly or mass.   EXTREMITIES: No pedal edema, cyanosis, or clubbing.  NEUROLOGIC: Cranial nerves II through XII are intact. Muscle strength 4/5 in all extremities. Sensation intact. Gait not checked.  PSYCHIATRIC: The patient is alert and oriented x 3.  SKIN: No obvious rash, lesion, or ulcer.   Physical Exam LABORATORY PANEL:   CBC Recent Labs  Lab 07/03/17 0555  WBC 13.0*  HGB 9.9*  HCT 30.3*  PLT 245   ------------------------------------------------------------------------------------------------------------------  Chemistries  Recent Labs  Lab 06/30/17 0206  07/03/17 0555  NA 143   < > 142  K 4.0   < > 3.7  CL 117*   < > 110  CO2 20*   < > 22  GLUCOSE 146*   < > 111*  BUN 59*   < > 33*  CREATININE 1.78*   < > 1.76*  CALCIUM 8.1*   < > 8.2*  AST 18  --   --   ALT 17  --   --   ALKPHOS 49  --   --   BILITOT 0.8  --   --    < > = values in this interval not displayed.   ------------------------------------------------------------------------------------------------------------------  Cardiac Enzymes Recent Labs  Lab 06/27/17 1332  TROPONINI 0.04*   ------------------------------------------------------------------------------------------------------------------  RADIOLOGY:  No results found.  ASSESSMENT AND PLAN:   Active Problems:   Hip pain   UGIB (upper gastrointestinal bleed)  * Acute blood loss anemia from GI bleed secondary to gastric ulcer Hemoglobin is stable Protonix p.o. twice daily I will start patient on Carafate  *Epigastric abdominal pain secondary to gastric ulcer Patient had EGD done on 06/29/2017 which has revealed bleeding gastric ulcer which was injected with epinephrine H.  pylori pending  *Leukocytosis Check a chest x-ray due to possible pneumonia  abdominal x-ray has revealed a developing pneumonia versus atelectasis.    *Right hip pain -X-ray is negative any acute pathology -Physical therapy evaluation pending    *BPH with urinary  retention -Continue tamsulosin, Foley has been placed  *Headache Tylenol as needed   to floor as patient is stable  All the records are reviewed and case discussed with Care Management/Social Workerr. Management plans discussed with the patient, he is  in agreement.  Discussed with intensivist Dr. Belia HemanKasa  CODE STATUS: Full. Brother [name] is the healthcare power of attorneyContact phone #[phone]  TOTAL TIME TAKING CARE OF THIS PATIENT: 32 minutes.     POSSIBLE D/C IN 1-2 DAYS, DEPENDING ON CLINICAL CONDITION.   Auburn BilberryPATEL, Boniface Goffe M.D on 07/03/2017   Between 7am to 6pm - Pager - [phone]  After 6pm go to [email or web address] - Social research officer, governmentpassword [email or web address]  [facility]  Office  [phone]  CC: Primary care physician; Shamleffer, Landry MellowIbethal Jaralla, MD  Note: This dictation was prepared with Dragon dictation along with smaller phrase technology. Any transcriptional errors that result from this process are unintentional.

## 2017-07-04 ENCOUNTER — Inpatient Hospital Stay: Payer: Medicare Other

## 2017-07-04 LAB — CBC
HCT: 27.6 % — ABNORMAL LOW (ref 40.0–52.0)
Hemoglobin: 9.2 g/dL — ABNORMAL LOW (ref 13.0–18.0)
MCH: 28.8 pg (ref 26.0–34.0)
MCHC: 33.1 g/dL (ref 32.0–36.0)
MCV: 86.9 fL (ref 80.0–100.0)
PLATELETS: 235 10*3/uL (ref 150–440)
RBC: 3.18 MIL/uL — AB (ref 4.40–5.90)
RDW: 14.5 % (ref 11.5–14.5)
WBC: 11 10*3/uL — AB (ref 3.8–10.6)

## 2017-07-04 LAB — BASIC METABOLIC PANEL
ANION GAP: 8 (ref 5–15)
BUN: 29 mg/dL — ABNORMAL HIGH (ref 6–20)
CO2: 22 mmol/L (ref 22–32)
Calcium: 8.4 mg/dL — ABNORMAL LOW (ref 8.9–10.3)
Chloride: 110 mmol/L (ref 101–111)
Creatinine, Ser: 1.7 mg/dL — ABNORMAL HIGH (ref 0.61–1.24)
GFR calc non Af Amer: 36 mL/min — ABNORMAL LOW (ref >60)
GFR, EST AFRICAN AMERICAN: 42 mL/min — AB (ref >60)
Glucose, Bld: 100 mg/dL — ABNORMAL HIGH (ref 65–99)
Potassium: 3.3 mmol/L — ABNORMAL LOW (ref 3.5–5.1)
SODIUM: 140 mmol/L (ref 135–145)

## 2017-07-04 LAB — GLUCOSE, CAPILLARY
GLUCOSE-CAPILLARY: 106 mg/dL — AB (ref 65–99)
GLUCOSE-CAPILLARY: 95 mg/dL (ref 65–99)

## 2017-07-04 MED ORDER — PANTOPRAZOLE SODIUM 40 MG PO TBEC: 40.0000 mg | DELAYED_RELEASE_TABLET | Freq: Two times a day (BID) | ORAL | 1 refills | Status: AC

## 2017-07-04 MED ORDER — POLYETHYLENE GLYCOL 3350 17 G PO PACK
17.0000 g | PACK | Freq: Every day | ORAL | 0 refills | Status: AC
Start: 2017-07-05 — End: ?

## 2017-07-04 MED ORDER — POLYETHYLENE GLYCOL 3350 17 G PO PACK
17.0000 g | PACK | Freq: Every day | ORAL | Status: DC
  Administered 2017-07-04: 12:00:00 17 g via ORAL
  Filled 2017-07-04: qty 1

## 2017-07-04 MED ORDER — LACTULOSE 10 GM/15ML PO SOLN
30.0000 g | Freq: Two times a day (BID) | ORAL | Status: DC
  Administered 2017-07-04: 12:00:00 30 g via ORAL
  Filled 2017-07-04: qty 60

## 2017-07-04 MED ORDER — BISACODYL 5 MG PO TBEC: 5.0000 mg | DELAYED_RELEASE_TABLET | Freq: Every day | ORAL | 0 refills | Status: AC | PRN

## 2017-07-04 MED ORDER — SUCRALFATE 1 GM/10ML PO SUSP: 1.0000 g | Freq: Three times a day (TID) | ORAL | 0 refills | Status: AC

## 2017-07-04 MED ORDER — POTASSIUM CHLORIDE CRYS ER 20 MEQ PO TBCR
40.0000 meq | EXTENDED_RELEASE_TABLET | Freq: Once | ORAL | Status: AC
  Administered 2017-07-04: 40 meq via ORAL
  Filled 2017-07-04: qty 2

## 2017-07-04 MED ORDER — DOCUSATE SODIUM 100 MG PO CAPS: 200.0000 mg | ORAL_CAPSULE | Freq: Two times a day (BID) | ORAL | 0 refills | Status: AC

## 2017-07-04 MED ORDER — DOCUSATE SODIUM 100 MG PO CAPS
200.0000 mg | ORAL_CAPSULE | Freq: Two times a day (BID) | ORAL | Status: DC
  Administered 2017-07-04: 12:00:00 200 mg via ORAL
  Filled 2017-07-04: qty 2

## 2017-07-04 MED ORDER — TRAZODONE HCL 50 MG PO TABS: 25.0000 mg | ORAL_TABLET | Freq: Every evening | ORAL | Status: AC | PRN

## 2017-07-04 NOTE — Progress Notes (Signed)
Patient discharged to [facility]. Report called to Marylene LandAngela RN at facility. EMS called for transportation.

## 2017-07-04 NOTE — Discharge Summary (Signed)
[facility] - [place] at [facility], 83 y.o., DOB [birth year], MRN [id number]. Admission date: 06/27/2017 Discharge Date 07/04/2017 Primary MD Shamleffer, Landry Mellow, MD Admitting Physician Delfino Lovett, MD  Admission Diagnosis  Upper GI bleed [K92.2] Stool guaiac positive [R19.5] Fall in home, initial encounter [W19.XXXA, Y92.009] Altered mental status, unspecified altered mental status type [R41.82]  Discharge Diagnosis   Active Problems: Blood loss anemia from GI bleeding secondary to gastric ulcer Leukocytosis Right hip pain BPH with urinary retention : Patient is Foley and a voiding trial in a week Headache Previous history of CVA Chronic kidney disease stage III Hypokalemia Anemia of chronic disease      Hospital Course Austin Dyer  is a 83 y.o. male with a known history of BPH, STROKE is being admitted for hip pain. Patient states that he fell last night that did not hit his head and did not lose consciousness. Patient was able to get up and get dressed this morning and with the help of neighbors got to the car to be brought to the emergency department. Patient states he was ambulatory after the accident. He attributes his fall to "his knees going out" He rates his pain as 4 out of 10. Patient lives alone.  His sister and brother are at bedside.  ED provider was worried about bleeding from GI tract, his Hemoccult stool was positive and hemoglobin was noted to be 8.9.  Patient was admitted for acute GI bleed.  He was seen by gastroenterology and underwent an endoscopy.  Which showed a gastric ulcer.  Patient had to be transfused.  Now his hemoglobin is stable he is on PPIs and asymptomatic. Patient also was noted to have urinary retention therefore a Foley had to be placed.  He will need outpatient urology follow-up.    Patient needs PT.          Consults gastroenterology  Significant Tests:  See full reports for all details     Dg Abd 1  View  Result Date: 06/30/2017 CLINICAL DATA:  Abdominal pain and vomiting. EXAM: ABDOMEN - 1 VIEW COMPARISON:  None. FINDINGS: The bowel gas pattern is normal. No radio-opaque calculi or other significant radiographic abnormality are seen. RIGHT femoral neck exostosis. LEFT lung base airspace opacity. IMPRESSION: Normal bowel gas pattern. LEFT lung base atelectasis or pneumonia. Recommend chest radiograph. Electronically Signed   By: Awilda Metro M.D.   On: 06/30/2017 03:40   Ct Head Wo Contrast  Result Date: 06/27/2017 CLINICAL DATA:  Patient fell last evening with multiple falls recently. Confusion and neck pain following fall. EXAM: CT HEAD WITHOUT CONTRAST CT CERVICAL SPINE WITHOUT CONTRAST TECHNIQUE: Multidetector CT imaging of the head and cervical spine was performed following the standard protocol without intravenous contrast. Multiplanar CT image reconstructions of the cervical spine were also generated. COMPARISON:  11/04/2011 MRI of the cervical spine FINDINGS: CT HEAD FINDINGS BRAIN: There is mild sulcal and mild to moderate ventricular prominence consistent with superficial and central atrophy. No intraparenchymal hemorrhage, mass effect nor midline shift. Periventricular and subcortical white matter hypodensities consistent with chronic small vessel ischemic disease are identified. Idiopathic basal ganglial calcifications are noted bilaterally. No acute large vascular territory infarcts. No abnormal extra-axial fluid collections. Basal cisterns are not effaced and midline. VASCULAR: Moderate calcific atherosclerosis of the carotid siphons. SKULL: No skull fracture. No significant scalp soft tissue swelling. SINUSES/ORBITS: The mastoid air-cells are clear. The included paranasal sinuses are well-aerated.The included ocular globes and orbital contents  are non-suspicious. OTHER: None. CT CERVICAL SPINE FINDINGS Alignment: Intact atlantodental and craniocervical relationships. Normal cervical  lordosis. Skull base and vertebrae: Incomplete posterior arch of C1, developmental in appearance. Subcortical cystic change of the anterior odontoid body. No acute cervical spine fracture. Intact skullbase. Soft tissues and spinal canal: No prevertebral fluid or swelling. No visible canal hematoma. Disc levels: C2-C3: No significant central or neural foraminal encroachment. No focal disc herniation. C3-C4: Partially calcified mild disc bulge. No significant neural foraminal or central canal stenosis. C4-C5: Mild partially calcified disc bulge without focal disc herniation, canal stenosis or neural foraminal encroachment. Mild left C4-5 uncinate process hypertrophy. C5-C6: No focal disc herniation, canal stenosis or significant neural foraminal encroachment. Small anterior osteophytes. Mild left-sided uncinate process hypertrophy. C6-C7: Small anterior osteophytes. No focal disc herniation, canal stenosis or significant neural foraminal encroachment. C7-T1:  Negative Upper chest: Left apical scarring. Other: No jumped or perched facets. IMPRESSION: 1. No acute intracranial nor cervical spine abnormality. 2. Chronic small vessel ischemic disease periventricular and subcortical white matter. 3. Mild cervical spondylosis. Electronically Signed   By: Tollie Eth M.D.   On: 06/27/2017 13:25   Ct Cervical Spine Wo Contrast  Result Date: 06/27/2017 CLINICAL DATA:  Patient fell last evening with multiple falls recently. Confusion and neck pain following fall. EXAM: CT HEAD WITHOUT CONTRAST CT CERVICAL SPINE WITHOUT CONTRAST TECHNIQUE: Multidetector CT imaging of the head and cervical spine was performed following the standard protocol without intravenous contrast. Multiplanar CT image reconstructions of the cervical spine were also generated. COMPARISON:  11/04/2011 MRI of the cervical spine FINDINGS: CT HEAD FINDINGS BRAIN: There is mild sulcal and mild to moderate ventricular prominence consistent with superficial and  central atrophy. No intraparenchymal hemorrhage, mass effect nor midline shift. Periventricular and subcortical white matter hypodensities consistent with chronic small vessel ischemic disease are identified. Idiopathic basal ganglial calcifications are noted bilaterally. No acute large vascular territory infarcts. No abnormal extra-axial fluid collections. Basal cisterns are not effaced and midline. VASCULAR: Moderate calcific atherosclerosis of the carotid siphons. SKULL: No skull fracture. No significant scalp soft tissue swelling. SINUSES/ORBITS: The mastoid air-cells are clear. The included paranasal sinuses are well-aerated.The included ocular globes and orbital contents are non-suspicious. OTHER: None. CT CERVICAL SPINE FINDINGS Alignment: Intact atlantodental and craniocervical relationships. Normal cervical lordosis. Skull base and vertebrae: Incomplete posterior arch of C1, developmental in appearance. Subcortical cystic change of the anterior odontoid body. No acute cervical spine fracture. Intact skullbase. Soft tissues and spinal canal: No prevertebral fluid or swelling. No visible canal hematoma. Disc levels: C2-C3: No significant central or neural foraminal encroachment. No focal disc herniation. C3-C4: Partially calcified mild disc bulge. No significant neural foraminal or central canal stenosis. C4-C5: Mild partially calcified disc bulge without focal disc herniation, canal stenosis or neural foraminal encroachment. Mild left C4-5 uncinate process hypertrophy. C5-C6: No focal disc herniation, canal stenosis or significant neural foraminal encroachment. Small anterior osteophytes. Mild left-sided uncinate process hypertrophy. C6-C7: Small anterior osteophytes. No focal disc herniation, canal stenosis or significant neural foraminal encroachment. C7-T1:  Negative Upper chest: Left apical scarring. Other: No jumped or perched facets. IMPRESSION: 1. No acute intracranial nor cervical spine abnormality.  2. Chronic small vessel ischemic disease periventricular and subcortical white matter. 3. Mild cervical spondylosis. Electronically Signed   By: Tollie Eth M.D.   On: 06/27/2017 13:25   US Renal  Result Date: 07/03/2017 CLINICAL DATA:  Acute kidney injury. EXAM: RENAL / URINARY TRACT ULTRASOUND COMPLETE COMPARISON:  None. FINDINGS: Right  Kidney: Length: 11.2 cm. 3.5 cm midpole cyst. 4 x 5 cm right lower pole cyst. Echogenicity within normal limits. No mass or hydronephrosis visualized. Left Kidney: Length: 12.6 cm. 5 cm upper pole cyst. 5 cm midpole cyst. 9 cm lower pole cyst. Echogenicity within normal limits. No mass or hydronephrosis visualized. Bladder: Empty with Foley catheter. IMPRESSION: Negative for hydronephrosis. Normal renal cortex. Multiple renal cysts bilaterally. Electronically Signed   By: Marlan Palau M.D.   On: 07/03/2017 14:07   Mr Hip Right Wo Contrast  Result Date: 06/28/2017 CLINICAL DATA:  Acute right hip pain EXAM: MR OF THE RIGHT HIP WITHOUT CONTRAST TECHNIQUE: Multiplanar, multisequence MR imaging was performed. No intravenous contrast was administered. COMPARISON:  Radiographs from 06/27/2017 FINDINGS: Bones: No abnormal marrow edema or other findings to suggest acute fracture involving the right hip or regional pelvis. Spur like projection medially along the proximal metaphysis of the right proximal femur appears chronic and may represent a spur related to the hip adductor musculature or conceivably a chronic osteochondroma. There is evidence of lumbar spondylosis and degenerative disc disease. Articular cartilage and labrum Articular cartilage: Preserved thickness, no appreciable focal defect. Labrum:  Grossly intact Joint or bursal effusion Joint effusion:  Absent Bursae:  No regional bursitis. Muscles and tendons Muscles and tendons: Mild abnormal edema laterally in the gluteus maximus, images 16-24 of series 7, probably reflecting a muscle strain. Mild proximal tendinopathy  in the right hamstring tendons. Subtle edema medially in both the right and left hip adductor musculature for example on image 15/7 which may reflect muscle strain. Other findings Miscellaneous: The prostate gland measures 7.4 by 8.8 by 8.8 cm (volume = 300 cm^3) and demonstrates internal heterogeneity in the central zone characteristic of benign prostatic hypertrophy, although a low T2 signal 2.5 by 2.1 by 2.6 cm (volume = 7.1 cm^3) focus in the right transitional zone is nonspecific with respect to malignancy. On image 19/4 there appear to be cystic lesions from the lower poles of the kidneys, similar findings were shown on the CT abdomen from 10/29/2011 and accordingly I doubt that this requires further workup. IMPRESSION: 1. No fracture or acute bony finding. 2. Strain of the lateral portion of the right gluteus maximus, potentially acute. There is also subtle edema medially in the adductor musculature both hips which could reflect a mild sprain. 3. Markedly enlarged prostate gland with volume estimated at 300 cc. No specific findings of malignancy although there is a low T2 signal nodule in the right transition zone which is nonspecific. 4. Osteochondroma or spur along the attachment site of some of the adductor musculature to the right femur, this is felt to be chronic. 5. Lumbar spondylosis and degenerative disc disease. These results will be called to the ordering clinician or representative by the Radiologist Assistant, and communication documented in the PACS or zVision Dashboard. Electronically Signed   By: Gaylyn Rong M.D.   On: 06/28/2017 08:03   Dg Chest Port 1 View  Result Date: 07/04/2017 CLINICAL DATA:  Cough EXAM: PORTABLE CHEST 1 VIEW COMPARISON:  10/28/2004 FINDINGS: Low volume chest which accentuates apparent cardiomegaly. Stable mediastinal contours. There is no edema, consolidation, effusion, or pneumothorax. There is a nodular density the left apex that is likely first  costochondral junction based on prior, but there is new asymmetric linear density at the left apex. IMPRESSION: 1. No acute finding. 2. Nodular density at the left apex may be hypertrophic first costochondral junction but there is new neighboring subpleural densities and  confirmation with noncontrast chest CT is recommended. Electronically Signed   By: Marnee SpringJonathon  Watts M.D.   On: 07/04/2017 08:01   Dg Hip Unilat W Or Wo Pelvis 2-3 Views Right  Result Date: 06/27/2017 CLINICAL DATA:  Right hip pain after fall yesterday. EXAM: DG HIP (WITH OR WITHOUT PELVIS) 2-3V RIGHT COMPARISON:  CT abdomen and pelvis dated October 29, 2011. FINDINGS: No acute fracture or malalignment. The bilateral hip joint spaces are relatively preserved. Probable small osteochondroma arising from the right medial femoral neck, unchanged. The sacroiliac joints and pubic symphysis are intact. Degenerative changes of the lower lumbar spine. IMPRESSION: No acute fracture or malalignment. If occult hip fracture is suspected or if the patient is unable to bear weight, MRI is the preferred modality for further evaluation. Electronically Signed   By: Obie DredgeWilliam T Derry M.D.   On: 06/27/2017 14:20       Today   Subjective:   Austin Dyer since feeling well had some nausea   Objective:   Blood pressure 127/70, pulse 90, temperature 98.6 F (37 C), temperature source Oral, resp. rate 20, height 5\' 8"  (1.727 m), weight 216 lb 8 oz (98.2 kg), SpO2 100 %.  .  Intake/Output Summary (Last 24 hours) at 07/04/2017 1239 Last data filed at 07/04/2017 1014 Gross per 24 hour  Intake 1100 ml  Output 1900 ml  Net -800 ml    Exam VITAL SIGNS: Blood pressure 127/70, pulse 90, temperature 98.6 F (37 C), temperature source Oral, resp. rate 20, height 5\' 8"  (1.727 m), weight 216 lb 8 oz (98.2 kg), SpO2 100 %.  GENERAL:  83 y.o.-year-old patient lying in the bed with no acute distress.  EYES: Pupils equal, round, reactive to light and accommodation. No  scleral icterus. Extraocular muscles intact.  HEENT: Head atraumatic, normocephalic. Oropharynx and nasopharynx clear.  NECK:  Supple, no jugular venous distention. No thyroid enlargement, no tenderness.  LUNGS: Normal breath sounds bilaterally, no wheezing, rales,rhonchi or crepitation. No use of accessory muscles of respiration.  CARDIOVASCULAR: S1, S2 normal. No murmurs, rubs, or gallops.  ABDOMEN: Soft, nontender, nondistended. Bowel sounds present. No organomegaly or mass.  EXTREMITIES: No pedal edema, cyanosis, or clubbing.  NEUROLOGIC: Cranial nerves II through XII are intact. Muscle strength 5/5 in all extremities. Sensation intact. Gait not checked.  PSYCHIATRIC: The patient is alert and oriented x 3.  SKIN: No obvious rash, lesion, or ulcer.   Data Review     CBC w Diff:  Lab Results  Component Value Date   WBC 11.0 (H) 07/04/2017   HGB 9.2 (L) 07/04/2017   HCT 27.6 (L) 07/04/2017   PLT 235 07/04/2017   LYMPHOPCT 4 07/01/2017   MONOPCT 8 07/01/2017   EOSPCT 4 07/01/2017   BASOPCT 1 07/01/2017   CMP:  Lab Results  Component Value Date   NA 140 07/04/2017   K 3.3 (L) 07/04/2017   CL 110 07/04/2017   CO2 22 07/04/2017   BUN 29 (H) 07/04/2017   CREATININE 1.70 (H) 07/04/2017   PROT 5.7 (L) 06/30/2017   ALBUMIN 2.5 (L) 06/30/2017   BILITOT 0.8 06/30/2017   ALKPHOS 49 06/30/2017   AST 18 06/30/2017   ALT 17 06/30/2017  .  Micro Results Recent Results (from the past 240 hour(s))  MRSA PCR Screening     Status: None   Collection Time: 06/29/17 10:12 AM  Result Value Ref Range Status   MRSA by PCR NEGATIVE NEGATIVE Final    Comment:  The GeneXpert MRSA Assay (FDA approved for NASAL specimens only), is one component of a comprehensive MRSA colonization surveillance program. It is not intended to diagnose MRSA infection nor to guide or monitor treatment for MRSA infections.         Code Status Orders  (From admission, onward)        Start      Ordered   06/27/17 2115  Full code  Continuous     06/27/17 2115    Code Status History    Date Active Date Inactive Code Status Order ID Comments User Context   This patient has a current code status but no historical code status.          Follow-up Information    Macyoledo, Boykin Nearingeodoro K, MD Follow up in 1 week(s).   Specialty:  Gastroenterology Contact information: [address]  [place] [address] [phone]        Shamleffer, Landry MellowIbethal Jaralla, MD Follow up in 1 week(s).   Specialty:  Internal Medicine Why:  hosp follow up Contact information: [address]  [address] [place] [address] [phone]           Discharge Medications   Allergies as of 07/04/2017   No Known Allergies     Medication List    TAKE these medications   acetaminophen 325 MG tablet Commonly known as:  TYLENOL Take 650 mg by mouth every 6 (six) hours as needed. For pain   bisacodyl 5 MG EC tablet Commonly known as:  DULCOLAX Take 1 tablet (5 mg total) daily as needed by mouth for moderate constipation.   docusate sodium 100 MG capsule Commonly known as:  COLACE Take 2 capsules (200 mg total) 2 (two) times daily by mouth.   finasteride 5 MG tablet Commonly known as:  PROSCAR Take 5 mg by mouth daily.   pantoprazole 40 MG tablet Commonly known as:  PROTONIX Take 1 tablet (40 mg total) 2 (two) times daily by mouth.   polyethylene glycol packet Commonly known as:  MIRALAX / GLYCOLAX Take 17 g daily by mouth. Start taking on:  07/05/2017   prednisoLONE acetate 1 % ophthalmic suspension Commonly known as:  PRED FORTE 1 drop 4 (four) times daily.   SIMBRINZA 1-0.2 % Susp Generic drug:  Brinzolamide-Brimonidine Apply 1 drop to eye. Place 1 (drop) into affected eye three times a day   sucralfate 1 GM/10ML suspension Commonly known as:  CARAFATE Take 10 mLs (1 g total) 4 (four) times daily -  with meals and at bedtime by mouth.   tamsulosin 0.4 MG Caps  capsule Commonly known as:  FLOMAX Take 0.4 mg by mouth daily. After the same meal each day.   traZODone 50 MG tablet Commonly known as:  DESYREL Take 0.5 tablets (25 mg total) at bedtime as needed by mouth for sleep.          Total Time in preparing paper work, data evaluation and todays exam - 35 minutes  Auburn BilberryPATEL, Arely Tinner M.D on 07/04/2017 at 12:39 PM  [facility]   Office  [phone]

## 2017-07-04 NOTE — Discharge Instructions (Signed)
[facility] ° °DIET:  °Cardiac diet ° °DISCHARGE CONDITION:  °Stable ° °ACTIVITY:  °Activity as tolerated ° °OXYGEN:  °Home Oxygen: No. °  °Oxygen Delivery: room air ° °DISCHARGE LOCATION:  °home  ° ° °ADDITIONAL DISCHARGE INSTRUCTION: ° ° °If you experience worsening of your admission symptoms, develop shortness of breath, life threatening emergency, suicidal or homicidal thoughts you must seek medical attention immediately by calling 911 or calling your MD immediately  if symptoms less severe. ° °You Must read complete instructions/literature along with all the possible adverse reactions/side effects for all the Medicines you take and that have been prescribed to you. Take any new Medicines after you have completely understood and accpet all the possible adverse reactions/side effects.  ° °Please note ° °You were cared for by a hospitalist during your hospital stay. If you have any questions about your discharge medications or the care you received while you were in the hospital after you are discharged, you can call the unit and asked to speak with the hospitalist on call if the hospitalist that took care of you is not available. Once you are discharged, your primary care physician will handle any further medical issues. Please note that NO REFILLS for any discharge medications will be authorized once you are discharged, as it is imperative that you return to your primary care physician (or establish a relationship with a primary care physician if you do not have one) for your aftercare needs so that they can reassess your need for medications and monitor your lab values. ° ° °

## 2017-07-04 NOTE — Clinical Social Work Note (Signed)
Clinical Social Work Assessment  Patient Details  Name: Austin Dyer MRN: [id number] Date of Birth: 1934/02/16  Date of referral:  07/04/17               Reason for consult:  Facility Placement                Permission sought to share information with:  Chartered certified accountant granted to share information::  Yes, Verbal Permission Granted  Name::      [place]::   [place]   Relationship::     Contact Information:     Housing/Transportation Living arrangements for the past 2 months:  [place] of Information:  Patient, Other (Comment Required)(Siblings) Patient Interpreter Needed:  None Criminal Activity/Legal Involvement Pertinent to Current Situation/Hospitalization:  No - Comment as needed Significant Relationships:  Siblings Lives with:  Self Do you feel safe going back to the place where you live?  Yes Need for family participation in patient care:  Yes (Comment)  Care giving concerns:  Patient lives alone in [place], [place].     Social Worker assessment / plan:  Holiday representative (CSW) reviewed chart and noted that PT is recommending SNF. CSW met with patient and his sister Austin Dyer and brother Austin Dyer were at bedside. Patient was pleasant but did not participate in assessment. CSW introduced self and explained role of CSW department. Patient's brother reported that patient lives alone and has nobody to take care of him. Per brother him and his sister live in [place]. CSW explained SNF process for short term and long term care. CSW explained that [facility] will pay for short term rehab however they will have to apply for long term care medicaid at DSS for patient if he needs long term care. Brother and sister verbalized their understanding and are agreeable to SNF search. FL2 complete and faxed out.   CSW presented bed offers to brother and sister. They chose [facility]. Patient is medically stable for D/C to  [facility] today. Per [facility] admissions coordinator at [facility] patient can come today to room 408. RN will call report and arrange EMS for transport. CSW sent D/C orders to [facility] via [facility]. Patient is aware of above. Patient's brother and sister are aware of above. Please reconsult if future social work needs arise. CSW signing off.   Employment status:  Disabled (Comment on whether or not currently receiving Disability) Insurance information:  Managed Medicare PT Recommendations:  [place] / Referral to community resources:  [place]  Patient/Family's Response to care:  Patient's brother and sister are agreeable for patient to go to [facility] for rehab.   Patient/Family's Understanding of and Emotional Response to Diagnosis, Current Treatment, and Prognosis:  Patient was very pleasant and thanked CSW for assistance.   Emotional Assessment Appearance:  Appears stated age Attitude/Demeanor/Rapport:    Affect (typically observed):  Accepting, Adaptable, Pleasant Orientation:  Oriented to Self, Oriented to Place, Oriented to  Time, Fluctuating Orientation (Suspected and/or reported Sundowners) Alcohol / Substance use:  Not Applicable Psych involvement (Current and /or in the community):  No (Comment)  Discharge Needs  Concerns to be addressed:  Discharge Planning Concerns Readmission within the last 30 days:  No Current discharge risk:  Dependent with Mobility Barriers to Discharge:  No Barriers Identified   Austin Dyer, Austin Beets, LCSW 07/04/2017, 2:20 PM

## 2017-07-04 NOTE — [place] FL2 (Signed)
[place] MEDICAID FL2 LEVEL OF CARE SCREENING TOOL     IDENTIFICATION  Patient Name: Austin Dyer Birthdate: 03/27/1934 Sex: male Admission Date (Current Location): 06/27/2017  [place]   Facility and Address:  [facility], [address], [place], [place] [address]      Provider Number: [id number]  Attending Physician Name and Address:  Auburn BilberryPatel, Shreyang, MD  Relative Name and Phone Number:       Current Level of Care: Hospital Recommended Level of Care: Skilled Nursing Facility Prior Approval Number:    Date Approved/Denied:   PASRR Number: [id number])  Discharge Plan: SNF    Current Diagnoses: Patient Active Problem List   Diagnosis Date Noted  . UGIB (upper gastrointestinal bleed) 06/28/2017  . Hip pain 06/27/2017    Orientation RESPIRATION BLADDER Height & Weight     Self, Time, Situation, Place  Normal Continent Weight: 216 lb 8 oz (98.2 kg) Height:  5\' 8"  (172.7 cm)  BEHAVIORAL SYMPTOMS/MOOD NEUROLOGICAL BOWEL NUTRITION STATUS      Incontinent Diet(Diet: Soft )  AMBULATORY STATUS COMMUNICATION OF NEEDS Skin   Extensive Assist Verbally Normal                       Personal Care Assistance Level of Assistance  Bathing, Feeding, Dressing Bathing Assistance: Limited assistance Feeding assistance: Independent Dressing Assistance: Limited assistance     Functional Limitations Info  Sight, Hearing, Speech Sight Info: Impaired(Blind ) Hearing Info: Adequate Speech Info: Adequate    SPECIAL CARE FACTORS FREQUENCY  PT (By licensed PT), OT (By licensed OT)     PT Frequency: (5) OT Frequency: (5)            Contractures      Additional Factors Info  Code Status, Allergies Code Status Info: (Full Code. ) Allergies Info: (No Known Allergies. )           Current Medications (07/04/2017):  This is the current hospital active medication list Current Facility-Administered Medications   Medication Dose Route Frequency Provider Last Rate Last Dose  . acetaminophen (TYLENOL) tablet 650 mg  650 mg Oral Q6H PRN Delfino LovettShah, Vipul, MD   650 mg at 07/04/17 0025   Or  . acetaminophen (TYLENOL) suppository 650 mg  650 mg Rectal Q6H PRN Delfino LovettShah, Vipul, MD      . bisacodyl (DULCOLAX) EC tablet 5 mg  5 mg Oral Daily PRN Delfino LovettShah, Vipul, MD      . brimonidine (ALPHAGAN) 0.2 % ophthalmic solution 1 drop  1 drop Left Eye BID Merwyn KatosSimonds, David B, MD   1 drop at 07/03/17 2213  . brinzolamide (AZOPT) 1 % ophthalmic suspension 1 drop  1 drop Left Eye BID Merwyn KatosSimonds, David B, MD   1 drop at 07/03/17 2213  . finasteride (PROSCAR) tablet 5 mg  5 mg Oral Daily Delfino LovettShah, Vipul, MD   5 mg at 07/03/17 2213  . hydrALAZINE (APRESOLINE) injection 10 mg  10 mg Intravenous Q4H PRN Karl ItoSommer, Steven E, MD   10 mg at 06/30/17 [id number]  . MEDLINE mouth rinse  15 mL Mouth Rinse BID Gouru, Aruna, MD   15 mL at 07/03/17 2219  . morphine 2 MG/ML injection 2 mg  2 mg Intravenous Q1H PRN Erin FullingKasa, Kurian, MD   2 mg at 06/30/17 1043  . morphine 2 MG/ML injection 2 mg  2 mg Intravenous Q4H PRN Gouru, Aruna, MD      . ondansetron (ZOFRAN) injection  4 mg  4 mg Intravenous Q6H PRN Delfino LovettShah, Vipul, MD   4 mg at 06/30/17 0805  . pantoprazole (PROTONIX) injection 40 mg  40 mg Intravenous Q12H Erin FullingKasa, Kurian, MD   40 mg at 07/03/17 2213  . prednisoLONE acetate (PRED FORTE) 1 % ophthalmic suspension 1 drop  1 drop Left Eye BID Merwyn KatosSimonds, David B, MD   1 drop at 07/03/17 2213  . sucralfate (CARAFATE) 1 GM/10ML suspension 1 g  1 g Oral TID WC & HS Auburn BilberryPatel, Shreyang, MD   1 g at 07/03/17 2213  . tamsulosin (FLOMAX) capsule 0.4 mg  0.4 mg Oral QPC breakfast Erin FullingKasa, Kurian, MD   0.4 mg at 07/03/17 1041  . traZODone (DESYREL) tablet 25 mg  25 mg Oral QHS PRN Delfino LovettShah, Vipul, MD   25 mg at 07/01/17 2227     Discharge Medications: Please see discharge summary for a list of discharge medications.  Relevant Imaging Results:  Relevant Lab Results:   Additional  Information (SSN: [id number])  Kally Cadden, Darleen CrockerBailey M, LCSW

## 2017-07-04 NOTE — Clinical Social Work Placement (Signed)
   CLINICAL SOCIAL WORK PLACEMENT  NOTE  Date:  07/04/2017  Patient Details  Name: Austin Dyer MRN: [id number] Date of Birth: 03/26/1934  Clinical Social Work is seeking post-discharge placement for this patient at the Skilled  Nursing Facility level of care (*CSW will initial, date and re-position this form in  chart as items are completed):  Yes   Patient/family provided with [facility] Clinical Social Work Department's list of facilities offering this level of care within the geographic area requested by the patient (or if unable, by the patient's family).  Yes   Patient/family informed of their freedom to choose among providers that offer the needed level of care, that participate in Medicare, Medicaid or managed care program needed by the patient, have an available bed and are willing to accept the patient.  Yes   Patient/family informed of [facility]'s ownership interest in [facility] and [facility], as well as of the fact that they are under no obligation to receive care at these facilities.  PASRR submitted to EDS on 07/04/17     PASRR number received on 07/04/17     Existing PASRR number confirmed on       FL2 transmitted to all facilities in geographic area requested by pt/family on 07/04/17     FL2 transmitted to all facilities within larger geographic area on       Patient informed that his/her managed care company has contracts with or will negotiate with certain facilities, including the following:        Yes   Patient/family informed of bed offers received.  Patient [facility] bed at [facility] )     Physician recommends and patient chooses bed at      Patient to be transferred to [facility] ) on 07/04/17.  Patient to be transferred to facility by [facility] )     Patient family notified on 07/04/17 of transfer.  Name of family member notified:  (Patient's sister Austin Dyer and brother Austin Dyer are at bedside and aware of D/C today. )      PHYSICIAN       Additional Comment:    _______________________________________________ Philipp Callegari, Darleen CrockerBailey M, LCSW 07/04/2017, 2:19 PM

## 2017-10-27 ENCOUNTER — Encounter (INDEPENDENT_AMBULATORY_CARE_PROVIDER_SITE_OTHER): Payer: Medicare Other | Admitting: Ophthalmology

## 2017-12-24 DEATH — deceased

## 2018-04-22 IMAGING — DX DG CHEST 1V PORT
1 series · 1 of 1 positions shown · non-contrast
Comparison: 10/28/2004

CLINICAL DATA: Cough

EXAM:
PORTABLE CHEST 1 VIEW

[chest ap]
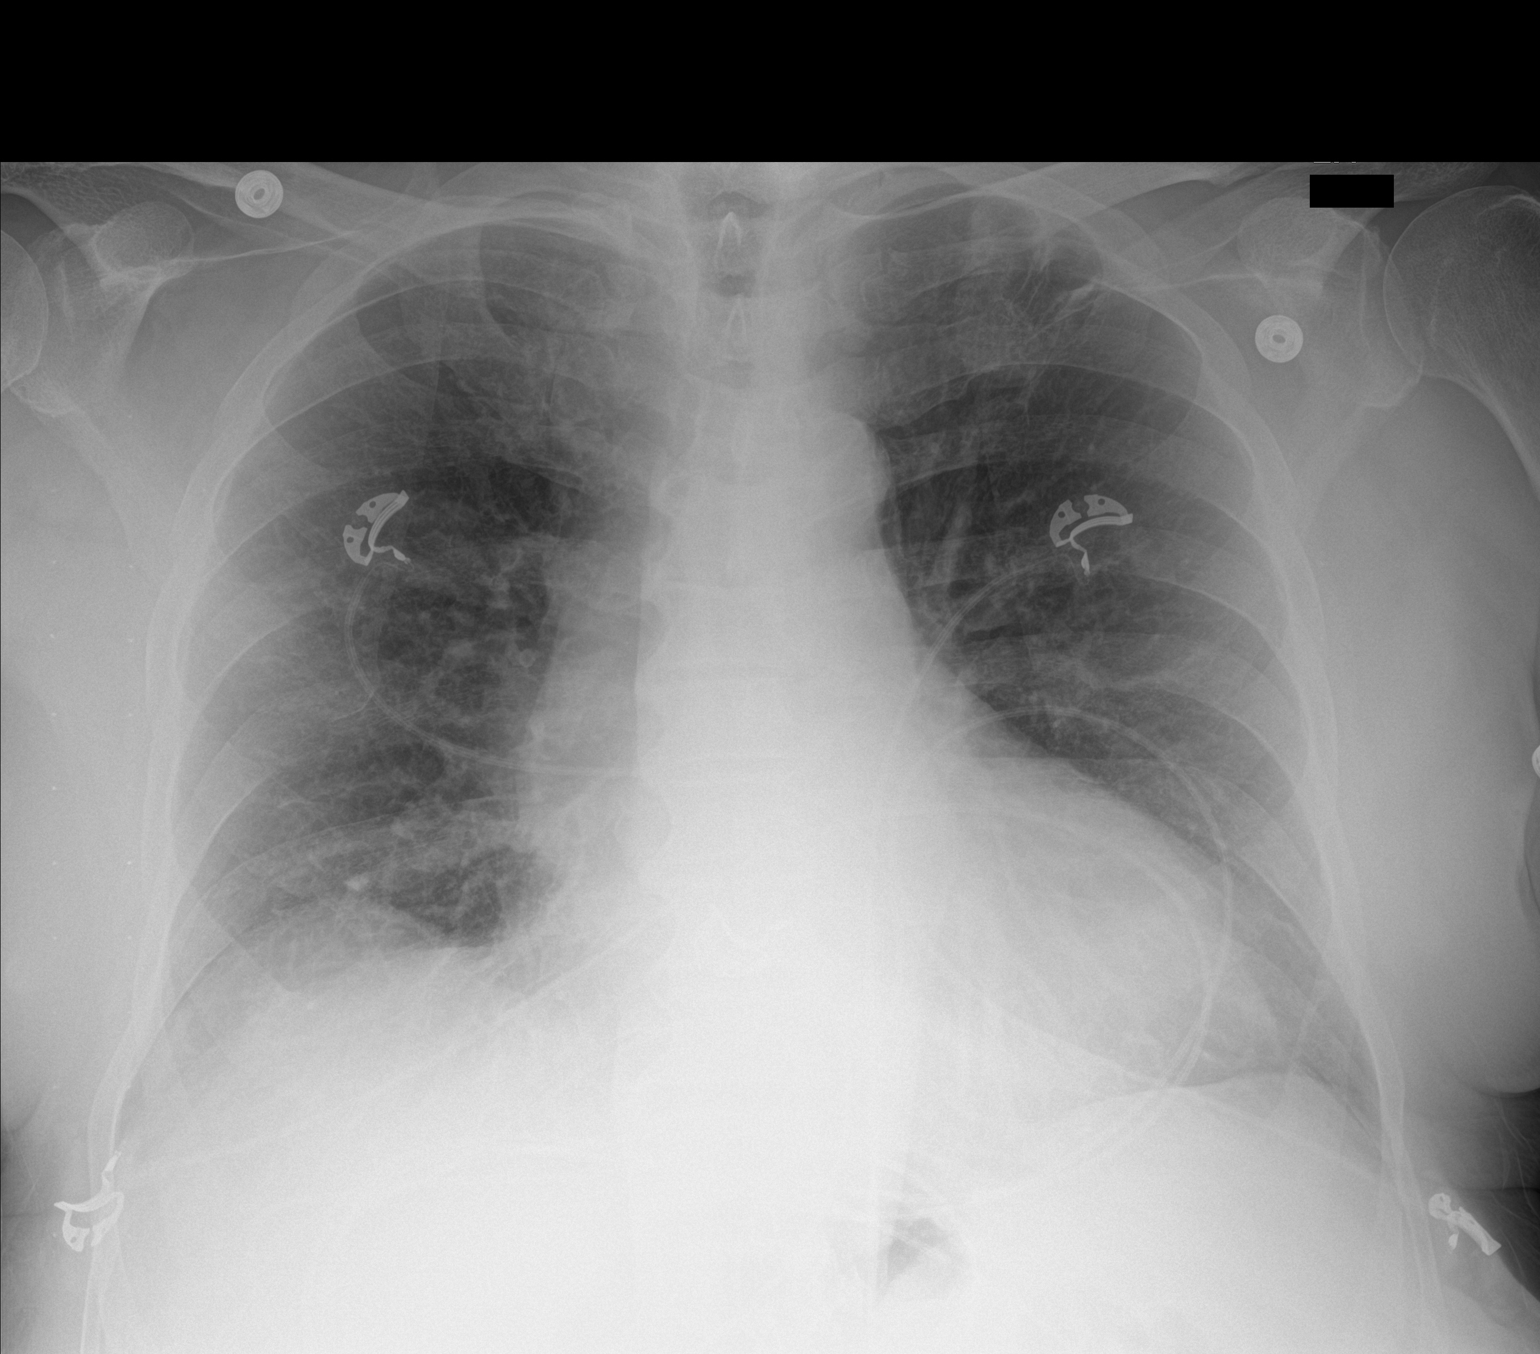

[1 of 1 positions shown; findings below may reference images not displayed]

FINDINGS: Low volume chest which accentuates apparent cardiomegaly. Stable
mediastinal contours. There is no edema, consolidation, effusion, or
pneumothorax. There is a nodular density the left apex that is
likely first costochondral junction based on prior, but there is new
asymmetric linear density at the left apex.
IMPRESSION: 1. No acute finding.
2. Nodular density at the left apex may be hypertrophic first
costochondral junction but there is new neighboring subpleural
densities and confirmation with noncontrast chest CT is recommended.
